# Patient Record
Sex: Female | Born: 1950
Health system: Southern US, Community
[De-identification: ages and names within clinical notes are randomized; demographics above are authoritative.]

## PROBLEM LIST (undated history)

## (undated) DIAGNOSIS — R7301 Impaired fasting glucose: Secondary | ICD-10-CM

## (undated) DIAGNOSIS — R319 Hematuria, unspecified: Secondary | ICD-10-CM

## (undated) DIAGNOSIS — K219 Gastro-esophageal reflux disease without esophagitis: Secondary | ICD-10-CM

## (undated) DIAGNOSIS — G9050 Complex regional pain syndrome I, unspecified: Secondary | ICD-10-CM

## (undated) DIAGNOSIS — I8393 Asymptomatic varicose veins of bilateral lower extremities: Secondary | ICD-10-CM

## (undated) DIAGNOSIS — I10 Essential (primary) hypertension: Secondary | ICD-10-CM

## (undated) DIAGNOSIS — E785 Hyperlipidemia, unspecified: Secondary | ICD-10-CM

## (undated) HISTORY — DX: Hematuria, unspecified: R31.9

## (undated) HISTORY — DX: Gastro-esophageal reflux disease without esophagitis: K21.9

## (undated) HISTORY — DX: Hyperlipidemia, unspecified: E78.5

## (undated) HISTORY — DX: Complex regional pain syndrome I, unspecified: G90.50

## (undated) HISTORY — DX: Impaired fasting glucose: R73.01

## (undated) HISTORY — DX: Essential (primary) hypertension: I10

## (undated) HISTORY — DX: Asymptomatic varicose veins of bilateral lower extremities: I83.93

---

## 1998-05-19 ENCOUNTER — Emergency Department (HOSPITAL_COMMUNITY): Admission: EM | Admit: 1998-05-19 | Discharge: 1998-05-19 | Payer: Self-pay | Admitting: Emergency Medicine

## 2000-06-03 ENCOUNTER — Other Ambulatory Visit: Admission: RE | Admit: 2000-06-03 | Discharge: 2000-06-03 | Payer: Self-pay | Admitting: Family Medicine

## 2000-11-29 ENCOUNTER — Other Ambulatory Visit: Admission: RE | Admit: 2000-11-29 | Discharge: 2000-11-29 | Payer: Self-pay | Admitting: Family Medicine

## 2000-12-10 ENCOUNTER — Encounter: Admission: RE | Admit: 2000-12-10 | Discharge: 2000-12-10 | Payer: Self-pay | Admitting: Family Medicine

## 2000-12-10 ENCOUNTER — Encounter: Payer: Self-pay | Admitting: Family Medicine

## 2001-07-03 ENCOUNTER — Other Ambulatory Visit: Admission: RE | Admit: 2001-07-03 | Discharge: 2001-07-03 | Payer: Self-pay | Admitting: Family Medicine

## 2001-08-18 ENCOUNTER — Ambulatory Visit (HOSPITAL_COMMUNITY): Admission: RE | Admit: 2001-08-18 | Discharge: 2001-08-18 | Payer: Self-pay | Admitting: Gastroenterology

## 2001-08-18 ENCOUNTER — Encounter (INDEPENDENT_AMBULATORY_CARE_PROVIDER_SITE_OTHER): Payer: Self-pay

## 2001-11-27 ENCOUNTER — Emergency Department (HOSPITAL_COMMUNITY): Admission: EM | Admit: 2001-11-27 | Discharge: 2001-11-27 | Payer: Self-pay | Admitting: *Deleted

## 2001-12-10 ENCOUNTER — Encounter: Admission: RE | Admit: 2001-12-10 | Discharge: 2001-12-10 | Payer: Self-pay | Admitting: Family Medicine

## 2001-12-10 ENCOUNTER — Encounter: Payer: Self-pay | Admitting: Family Medicine

## 2002-07-30 ENCOUNTER — Other Ambulatory Visit: Admission: RE | Admit: 2002-07-30 | Discharge: 2002-07-30 | Payer: Self-pay | Admitting: Family Medicine

## 2002-08-03 ENCOUNTER — Encounter: Payer: Self-pay | Admitting: Family Medicine

## 2002-08-03 ENCOUNTER — Encounter: Admission: RE | Admit: 2002-08-03 | Discharge: 2002-08-03 | Payer: Self-pay | Admitting: Family Medicine

## 2002-09-23 ENCOUNTER — Ambulatory Visit (HOSPITAL_COMMUNITY): Admission: RE | Admit: 2002-09-23 | Discharge: 2002-09-23 | Payer: Self-pay | Admitting: Gastroenterology

## 2002-09-25 ENCOUNTER — Encounter: Payer: Self-pay | Admitting: Gastroenterology

## 2002-09-25 ENCOUNTER — Ambulatory Visit (HOSPITAL_COMMUNITY): Admission: RE | Admit: 2002-09-25 | Discharge: 2002-09-25 | Payer: Self-pay | Admitting: Gastroenterology

## 2002-10-05 ENCOUNTER — Ambulatory Visit (HOSPITAL_COMMUNITY): Admission: RE | Admit: 2002-10-05 | Discharge: 2002-10-05 | Payer: Self-pay | Admitting: Gastroenterology

## 2002-10-05 ENCOUNTER — Encounter: Payer: Self-pay | Admitting: Gastroenterology

## 2003-08-09 ENCOUNTER — Encounter: Admission: RE | Admit: 2003-08-09 | Discharge: 2003-08-09 | Payer: Self-pay | Admitting: Family Medicine

## 2003-10-02 ENCOUNTER — Encounter: Admission: RE | Admit: 2003-10-02 | Discharge: 2003-10-02 | Payer: Self-pay | Admitting: Family Medicine

## 2004-08-01 ENCOUNTER — Other Ambulatory Visit: Admission: RE | Admit: 2004-08-01 | Discharge: 2004-08-01 | Payer: Self-pay | Admitting: Family Medicine

## 2005-08-28 ENCOUNTER — Other Ambulatory Visit: Admission: RE | Admit: 2005-08-28 | Discharge: 2005-08-28 | Payer: Self-pay | Admitting: Family Medicine

## 2006-08-28 ENCOUNTER — Other Ambulatory Visit: Admission: RE | Admit: 2006-08-28 | Discharge: 2006-08-28 | Payer: Self-pay | Admitting: Family Medicine

## 2006-09-12 ENCOUNTER — Encounter: Admission: RE | Admit: 2006-09-12 | Discharge: 2006-09-12 | Payer: Self-pay | Admitting: Family Medicine

## 2006-11-12 ENCOUNTER — Encounter: Admission: RE | Admit: 2006-11-12 | Discharge: 2006-11-12 | Payer: Self-pay | Admitting: Family Medicine

## 2006-12-29 ENCOUNTER — Encounter: Admission: RE | Admit: 2006-12-29 | Discharge: 2006-12-29 | Payer: Self-pay | Admitting: Family Medicine

## 2007-07-16 ENCOUNTER — Encounter: Admission: RE | Admit: 2007-07-16 | Discharge: 2007-07-16 | Payer: Self-pay | Admitting: Family Medicine

## 2007-08-28 ENCOUNTER — Other Ambulatory Visit: Admission: RE | Admit: 2007-08-28 | Discharge: 2007-08-28 | Payer: Self-pay | Admitting: Family Medicine

## 2007-10-10 ENCOUNTER — Encounter: Admission: RE | Admit: 2007-10-10 | Discharge: 2007-10-10 | Payer: Self-pay | Admitting: Family Medicine

## 2007-10-21 ENCOUNTER — Encounter: Admission: RE | Admit: 2007-10-21 | Discharge: 2007-10-21 | Payer: Self-pay | Admitting: Family Medicine

## 2008-08-30 ENCOUNTER — Other Ambulatory Visit: Admission: RE | Admit: 2008-08-30 | Discharge: 2008-08-30 | Payer: Self-pay | Admitting: Family Medicine

## 2009-06-03 ENCOUNTER — Encounter: Admission: RE | Admit: 2009-06-03 | Discharge: 2009-06-03 | Payer: Self-pay | Admitting: Family Medicine

## 2009-09-01 ENCOUNTER — Other Ambulatory Visit: Admission: RE | Admit: 2009-09-01 | Discharge: 2009-09-01 | Payer: Self-pay | Admitting: Family Medicine

## 2010-06-05 ENCOUNTER — Encounter
Admission: RE | Admit: 2010-06-05 | Discharge: 2010-06-05 | Payer: Self-pay | Source: Home / Self Care | Attending: Family Medicine | Admitting: Family Medicine

## 2010-09-29 NOTE — Procedures (Signed)
[facility]  Patient:    Nicole Mcintosh, KANGAS Visit Number: [id number] MRN: [id number]          Service Type: END Location: ENDO Attending Physician:  Dennison Bulla Ii Dictated by:   Verlin Grills, M.D. Proc. Date: 08/18/01 Admit Date:  08/18/2001   CC:         Desma Maxim, M.D.   Procedure Report  PROCEDURE:  Screening colonoscopy.  REFERRING PHYSICIAN:  Desma Maxim, M.D.  PROCEDURE INDICATION:  Ms. Kaylanni Ezelle. Lamboy is a 61 year old female born [birth year].  Ms. Kalis is referred for her first screening colonoscopy with polypectomy to prevent colon cancer.  I discussed with Ms. Villwock the complications associated with colonoscopy and polypectomy, including a 15 per 1000 risk of bleeding and 4 per 1000 risk of colon perforation requiring surgical repair.  Ms. Whitacre has signed the operative permit.  ENDOSCOPIST:  Verlin Grills, M.D.  PREMEDICATION:  Demerol 50 mg, Versed 10 mg.  ENDOSCOPE:  Olympus pediatric colonoscope.  DESCRIPTION OF PROCEDURE:  After obtaining informed consent, Ms. Dix was placed in the left lateral decubitus position.  I administered intravenous Demerol and intravenous Versed to achieve conscious sedation for the procedure.  The patients blood pressure, oxygen saturation, and cardiac rhythm were monitored throughout the procedure and documented in the medical record.  Anal inspection was normal.  Digital rectal exam was normal.  The Olympus pediatric video colonoscope was introduced into the rectum and easily advanced to the cecum.  Colonic preparation for the exam today was excellent.  RECTUM:  From the proximal-mid rectum, three 0.5 mm sessile polyps were removed with the cold biopsy forceps and submitted for pathological interpretation.  SIGMOID COLON AND DESCENDING COLON:  Normal.  SPLENIC FLEXURE:  Normal.  TRANSVERSE COLON:  Normal.  HEPATIC FLEXURE:   Normal.  ASCENDING COLON:  Normal.  CECUM AND ILEOCECAL VALVE:  Normal.  ASSESSMENT: 1. Three 0.5 mm sessile polyps were removed from the proximal-mid rectum and    appear to be hyperplastic polyps endoscopically. 2. Otherwise normal proctocolonoscopy to the cecum.  RECOMMENDATIONS: 1. If rectal polyps return neoplastic pathologically,  Ms. Batty should    undergo a repeat colonoscopy in five years. 2. If the rectal polyps are nonneoplastic polyps, Ms. Shimmel should undergo    a repeat colonoscopy in 10 years. Dictated by:   Verlin Grills, M.D. Attending Physician:  Dennison Bulla Ii DD:  08/18/01 TD:  08/18/01 Job: [id number] [id number]

## 2010-09-29 NOTE — Op Note (Signed)
NAME:  Nicole Mcintosh, Nicole Mcintosh                       ACCOUNT NO.:  [id number]   MEDICAL RECORD NO.:  [id number]                   PATIENT TYPE:  AMB   LOCATION:  ENDO                                 FACILITY:  [facility]   PHYSICIAN:  Danise Edge, M.D.                DATE OF BIRTH:  10-14-1950   DATE OF PROCEDURE:  09/23/2002  DATE OF DISCHARGE:                                 OPERATIVE REPORT   REFERRING PHYSICIAN:  Donia Guiles, M.D.   PROCEDURE:  Esophagogastroduodenoscopy.   PROCEDURE INDICATION:  Nicole Mcintosh is a 60 year old female, born  December 18, 1950.  Nicole Mcintosh is scheduled to undergo  esophagogastroduodenoscopy to evaluate persistent postprandial nausea and  vomiting for 6-8  months associated with weight loss.   ENDOSCOPIST:  Charolett Bumpers, M.D.   PREMEDICATION:  1. Versed 10 mg.  2. Demerol 50 mg.  3. Phenergan 12.5 mg.   DESCRIPTION OF PROCEDURE:  After obtaining informed consent, Nicole Mcintosh  was placed in the left lateral decubitus position.  I administered  intravenous Demerol and intravenous Versed to achieve conscious sedation for  the procedure.  The patient's blood pressure, oxygen saturation, and cardiac  rhythm were monitored throughout the procedure and documented in the medical  record.   The Olympus gastroscope was passed through the posterior hypopharynx into  the proximal esophagus without difficulty.  The hypopharynx, larynx, and  vocal cords appeared normal.   ESOPHAGOSCOPY:  The proximal, mid, and lower segments of the esophagus  appear completely normal.  Endoscopically, there is no evidence for the  presence of esophageal obstruction, esophageal mucosal scarring, Barrett's  esophagus, erosive esophagitis, or esophageal ulcers.   GASTROSCOPY:  Retroflexed view of the gastric cardia and fundus was normal.  There is no endoscopic evidence for the presence of a hiatal hernia.  The  gastric body appears completely normal.   There are scattered linear and  punctate erosions with exudative bases in the gastric antrum with a patent  and normal-appearing pylorus.  A biopsy was taken from the distal gastric  antrum for a CLOtest.   DUODENOSCOPY:  The duodenal bulb, mid duodenum, and distal duodenum appeared  normal.  I did visualize the major papillae tangentially, and the major  papillae appeared normal.   ASSESSMENT:  1. Erosions in the gastric antrum; otherwise normal     esophagogastroduodenoscopy.  2. I cannot explain Nicole Mcintosh' persistent nausea and vomiting.   PLAN:  I will schedule Nicole Mcintosh for a CT scan of the abdomen and pelvis  with particular attention to the pancreas and also looking for any signs of  small bowel obstruction.  If normal, I will schedule her for a nuclear  medicine gastric emptying study.  Danise Edge, M.D.    MJ/MEDQ  D:  09/23/2002  T:  09/23/2002  Job:  [id number]   cc:   Donia Guiles, M.D.  [address] [place]  [place] [address]  Fax: [phone]

## 2010-10-04 ENCOUNTER — Other Ambulatory Visit: Payer: Self-pay | Admitting: Family Medicine

## 2010-10-04 ENCOUNTER — Other Ambulatory Visit (HOSPITAL_COMMUNITY)
Admission: RE | Admit: 2010-10-04 | Discharge: 2010-10-04 | Disposition: A | Discharge status: Home / Self Care | Payer: Medicare Other | Source: Ambulatory Visit | Attending: Family Medicine | Admitting: Family Medicine

## 2010-10-04 DIAGNOSIS — Z124 Encounter for screening for malignant neoplasm of cervix: Secondary | ICD-10-CM | POA: Insufficient documentation

## 2011-05-21 ENCOUNTER — Other Ambulatory Visit: Payer: Self-pay | Admitting: Family Medicine

## 2011-05-21 DIAGNOSIS — Z1231 Encounter for screening mammogram for malignant neoplasm of breast: Secondary | ICD-10-CM

## 2011-06-07 ENCOUNTER — Ambulatory Visit: Payer: Medicare Other

## 2011-06-14 ENCOUNTER — Ambulatory Visit
Admission: RE | Admit: 2011-06-14 | Discharge: 2011-06-14 | Disposition: A | Discharge status: Home / Self Care | Payer: Medicare Other | Source: Ambulatory Visit | Attending: Family Medicine | Admitting: Family Medicine

## 2011-06-14 DIAGNOSIS — Z1231 Encounter for screening mammogram for malignant neoplasm of breast: Secondary | ICD-10-CM

## 2011-09-25 ENCOUNTER — Other Ambulatory Visit: Payer: Self-pay | Admitting: Gastroenterology

## 2012-04-16 ENCOUNTER — Ambulatory Visit: Payer: Medicare Other | Attending: Neurology | Admitting: Physical Therapy

## 2012-04-16 DIAGNOSIS — IMO0001 Reserved for inherently not codable concepts without codable children: Secondary | ICD-10-CM | POA: Insufficient documentation

## 2012-04-16 DIAGNOSIS — M25619 Stiffness of unspecified shoulder, not elsewhere classified: Secondary | ICD-10-CM | POA: Insufficient documentation

## 2012-04-16 DIAGNOSIS — M2550 Pain in unspecified joint: Secondary | ICD-10-CM | POA: Insufficient documentation

## 2012-04-22 ENCOUNTER — Ambulatory Visit: Payer: Medicare Other | Admitting: Physical Therapy

## 2012-04-25 ENCOUNTER — Encounter: Payer: Medicare Other | Admitting: Physical Therapy

## 2012-04-28 ENCOUNTER — Encounter: Payer: Medicare Other | Admitting: Physical Therapy

## 2012-04-30 ENCOUNTER — Encounter: Payer: Medicare Other | Admitting: Physical Therapy

## 2012-05-12 ENCOUNTER — Ambulatory Visit: Payer: Medicare Other | Admitting: Physical Therapy

## 2012-05-20 ENCOUNTER — Other Ambulatory Visit: Payer: Self-pay | Admitting: Family Medicine

## 2012-05-20 DIAGNOSIS — Z1231 Encounter for screening mammogram for malignant neoplasm of breast: Secondary | ICD-10-CM

## 2012-05-22 ENCOUNTER — Ambulatory Visit: Payer: Medicare Other | Attending: Neurology | Admitting: Physical Therapy

## 2012-05-22 DIAGNOSIS — M25619 Stiffness of unspecified shoulder, not elsewhere classified: Secondary | ICD-10-CM | POA: Insufficient documentation

## 2012-05-22 DIAGNOSIS — M2550 Pain in unspecified joint: Secondary | ICD-10-CM | POA: Insufficient documentation

## 2012-05-22 DIAGNOSIS — IMO0001 Reserved for inherently not codable concepts without codable children: Secondary | ICD-10-CM | POA: Insufficient documentation

## 2012-05-29 ENCOUNTER — Ambulatory Visit: Payer: Medicare Other | Admitting: Physical Therapy

## 2012-06-06 ENCOUNTER — Ambulatory Visit: Payer: Medicare Other | Admitting: Physical Therapy

## 2012-06-20 ENCOUNTER — Ambulatory Visit: Payer: Medicare Other

## 2012-06-30 ENCOUNTER — Ambulatory Visit: Payer: Medicare Other

## 2012-07-29 ENCOUNTER — Ambulatory Visit: Payer: Medicare Other

## 2012-07-31 ENCOUNTER — Ambulatory Visit
Admission: RE | Admit: 2012-07-31 | Discharge: 2012-07-31 | Disposition: A | Discharge status: Home / Self Care | Payer: Medicare Other | Source: Ambulatory Visit | Attending: Family Medicine | Admitting: Family Medicine

## 2012-07-31 DIAGNOSIS — Z1231 Encounter for screening mammogram for malignant neoplasm of breast: Secondary | ICD-10-CM

## 2013-06-24 ENCOUNTER — Other Ambulatory Visit: Payer: Self-pay

## 2013-06-24 DIAGNOSIS — Z1231 Encounter for screening mammogram for malignant neoplasm of breast: Secondary | ICD-10-CM

## 2013-08-03 ENCOUNTER — Ambulatory Visit: Payer: Medicare Other

## 2013-08-27 ENCOUNTER — Ambulatory Visit
Admission: RE | Admit: 2013-08-27 | Discharge: 2013-08-27 | Disposition: A | Discharge status: Home / Self Care | Payer: Medicare Other | Source: Ambulatory Visit

## 2013-08-27 DIAGNOSIS — Z1231 Encounter for screening mammogram for malignant neoplasm of breast: Secondary | ICD-10-CM

## 2013-10-29 ENCOUNTER — Other Ambulatory Visit (HOSPITAL_COMMUNITY)
Admission: RE | Admit: 2013-10-29 | Discharge: 2013-10-29 | Disposition: A | Discharge status: Home / Self Care | Payer: Medicare Other | Source: Ambulatory Visit | Attending: Family Medicine | Admitting: Family Medicine

## 2013-10-29 ENCOUNTER — Other Ambulatory Visit: Payer: Self-pay | Admitting: Family Medicine

## 2013-10-29 ENCOUNTER — Ambulatory Visit
Admission: RE | Admit: 2013-10-29 | Discharge: 2013-10-29 | Disposition: A | Discharge status: Home / Self Care | Payer: Medicare Other | Source: Ambulatory Visit | Attending: Family Medicine | Admitting: Family Medicine

## 2013-10-29 DIAGNOSIS — Z1151 Encounter for screening for human papillomavirus (HPV): Secondary | ICD-10-CM | POA: Insufficient documentation

## 2013-10-29 DIAGNOSIS — Z124 Encounter for screening for malignant neoplasm of cervix: Secondary | ICD-10-CM | POA: Insufficient documentation

## 2013-10-29 DIAGNOSIS — M79671 Pain in right foot: Secondary | ICD-10-CM

## 2013-11-02 LAB — CYTOLOGY - PAP

## 2013-11-27 ENCOUNTER — Ambulatory Visit (INDEPENDENT_AMBULATORY_CARE_PROVIDER_SITE_OTHER): Payer: Medicare Other | Admitting: Interventional Cardiology

## 2013-11-27 ENCOUNTER — Encounter: Payer: Self-pay | Admitting: *Deleted

## 2013-11-27 VITALS — BP 150/82 | HR 61 | Ht 67.0 in | Wt 230.0 lb

## 2013-11-27 DIAGNOSIS — E785 Hyperlipidemia, unspecified: Secondary | ICD-10-CM | POA: Insufficient documentation

## 2013-11-27 DIAGNOSIS — R072 Precordial pain: Secondary | ICD-10-CM

## 2013-11-27 DIAGNOSIS — I10 Essential (primary) hypertension: Secondary | ICD-10-CM

## 2013-11-27 DIAGNOSIS — I209 Angina pectoris, unspecified: Secondary | ICD-10-CM | POA: Insufficient documentation

## 2013-11-27 DIAGNOSIS — R9431 Abnormal electrocardiogram [ECG] [EKG]: Secondary | ICD-10-CM

## 2013-11-27 DIAGNOSIS — G9050 Complex regional pain syndrome I, unspecified: Secondary | ICD-10-CM | POA: Insufficient documentation

## 2013-11-27 DIAGNOSIS — K219 Gastro-esophageal reflux disease without esophagitis: Secondary | ICD-10-CM

## 2013-11-27 MED ORDER — NITROGLYCERIN 0.4 MG SL SUBL: 0.4000 mg | SUBLINGUAL_TABLET | SUBLINGUAL | Status: AC | PRN

## 2013-11-27 MED ORDER — ASPIRIN EC 81 MG PO TBEC: 81.0000 mg | DELAYED_RELEASE_TABLET | Freq: Every day | ORAL | Status: AC

## 2013-11-27 MED ORDER — LOVASTATIN 40 MG PO TABS: 40.0000 mg | ORAL_TABLET | Freq: Every day | ORAL | Status: AC

## 2013-11-27 NOTE — Progress Notes (Signed)
Patient ID: Nicole Mcintosh, female   DOB: 1950/12/19, 63 y.o.   MRN: [id number]   Date: 11/27/2013 ID: Nicole Mcintosh, DOB 06-Nov-1950, MRN [id number] PCP: No primary provider on file.  Reason:  Chest discomfort  ASSESSMENT;  1.  Prolonged chest pressure occurring on 11/20/2013 lasting 5-7 hours before resolving and associated with diaphoresis and dyspnea. 4 days later EKG demonstrated lateral and mid precordial T-wave inversions that were new and suggestive of ischemia. Since that time she has had fatigue and mild dyspnea on exertion. I am concerned that this all represents an acute coronary syndrome, that could even include the possibility of stress cardiomyopathy. The patient is now 7 days beyond the event. 2. Hypertension 3. Hyperlipidemia 4. Reflux 5. History of coronary atherosclerosis 6. Obesity 7. Glucose intolerant  PLAN:  1. ECG repeated (The EKG today demonstrates persisting T wave abnormality aVL, V1 through V2 but appears somewhat improved compared to the July 14 EKG done by Dr. Alroy Dust) per 2. Start aspirin 81 mg daily 3. Stress Cardiolite ASAP, within the next 3-5 days. If the study is abnormal I will perform coronary angiography at my earliest convenience. 4. Report to the emergency room if any recurrent chest tightness 5. Nitroglycerin if recurrent chest discomfort 6. Continue statin therapy 7. Continue beta blocker    SUBJECTIVE: Nicole Mcintosh is a 63 y.o. female who is referred for evaluation of chest discomfort. The episode occurred on 11/20/13. It occurred while she in the hospital while shopping. He was admitted sternal with radiation to the throat and associated with diaphoresis and dyspnea. The discomfort lasted approximately 6 hours before resolving. As it resolved, the discomfort moved into her back. Since that time she has had less energy and some dyspnea on exertion. She has not had a recurrence of the discomfort. She denies orthopnea PND. No prior  history of heart disease.   Allergies  Allergen Reactions  . Celebrex [Celecoxib] Swelling  . Cymbalta [Duloxetine Hcl]     Weight gain  . Lyrica [Pregabalin]     Weight gain   . Meloxicam     Inneffective      Medication List       This list is accurate as of: 11/27/13  4:53 PM.  Always use your most recent med list.               ascorbic acid 1000 MG tablet  Commonly known as:  VITAMIN C  Take 1,000 mg by mouth daily.     calcium citrate-vitamin D 315-200 MG-UNIT per tablet  Commonly known as:  CITRACAL+D  Take 1 tablet by mouth daily.     carisoprodol 350 MG tablet  Commonly known as:  SOMA  Take 350 mg by mouth 4 (four) times daily as needed for muscle spasms.     CENTRUM SILVER PO  Take by mouth daily.     D3-1000 1000 UNITS capsule  Generic drug:  Cholecalciferol  Take 1,000 Units by mouth daily.     folic acid 258 MCG tablet  Commonly known as:  FOLVITE  Take 400 mcg by mouth daily.     HYDROcodone-acetaminophen 10-325 MG per tablet  Commonly known as:  NORCO  Take 1 tablet by mouth every 6 (six) hours as needed.     irbesartan-hydrochlorothiazide 300-12.5 MG per tablet  Commonly known as:  AVALIDE  Take 1 tablet by mouth daily.     lansoprazole 30 MG capsule  Commonly known as:  PREVACID  Take 30  mg by mouth daily at 12 noon.     metoprolol 50 MG tablet  Commonly known as:  LOPRESSOR  Take 50 mg by mouth 2 (two) times daily.     Omega 3 1000 MG Caps  Take by mouth 4 (four) times daily.     omega-3 acid ethyl esters 1 G capsule  Commonly known as:  LOVAZA  Take by mouth 2 (two) times daily.     promethazine 25 MG tablet  Commonly known as:  PHENERGAN  Take 25 mg by mouth as needed for nausea or vomiting.     temazepam 30 MG capsule  Commonly known as:  RESTORIL  Take 30 mg by mouth at bedtime as needed for sleep.        Past Medical History  Diagnosis Date  . Hypertension   . Hyperlipidemia   . Esophageal reflux   . RSD  (reflex sympathetic dystrophy)   . Hematuria   . Impaired fasting glucose     History reviewed. No pertinent past surgical history.  History   Social History  . Marital Status: Divorced    Spouse Name: N/A    Number of Children: N/A  . Years of Education: N/A   Occupational History  . Not on file.   Social History Main Topics  . Smoking status: Former Smoker    Quit date: 05/14/1978  . Smokeless tobacco: Not on file  . Alcohol Use: No  . Drug Use: No  . Sexual Activity: Not on file   Other Topics Concern  . Not on file   Social History Narrative  . No narrative on file    Family History  Problem Relation Age of Onset  . Hyperlipidemia Mother   . Coronary artery disease Mother   . Heart attack Father   . Hypertension Father   . Coronary artery disease Father     ROS: No prior history of exertional chest discomfort or heart disease. There is a family history of premature coronary atherosclerosis. Father died of myocardial infarction at age 63. She denies claudication, stroke, and vascular disease history. Obese a long-standing history of hypertension. On therapy for her lipids and high blood pressure. Denies palpitations, transient neurological symptoms, nausea, vomiting, decreased appetite, and thyroid disease  Other systems negative for complaints.  OBJECTIVE: BP 150/82  Pulse 61  Ht 5\' 7"  (1.702 m)  Wt 230 lb (104.327 kg)  BMI 36.01 kg/m2,  General: No acute distress, obese HEENT: normal without or pallor Neck: JVD flat. Carotids absent Chest: Clear Cardiac: Murmur: None. Gallop: S4. Rhythm: Normal. Other: Normal Abdomen: Bruit: Absent. Pulsation: Absent Extremities: Edema: None. Pulses: 2+ Neuro: Normal Psych: Normal  ECG: T-wave abnormality 1, aVL, V1 through V2. Improved in appearance when compared to tracing from Dr. Alroy Dust on 11/24/13 when T-wave inversion was much deeper and extended to the 4.

## 2013-11-27 NOTE — Patient Instructions (Addendum)
Your physician has recommended you make the following change in your medication:  1. BABY ASPIRIN 81 MG DAILY  2. START NITRO AS NEEDED   Your physician has requested that you have en exercise stress myoview. For further information please visit [email or web address]. Please follow instruction sheet, as given.  Your physician recommends that you schedule a follow-up appointment in:  [place] IF ANY CHEST PAIN OCCURS    Nitroglycerin sublingual tablets What is this medicine? NITROGLYCERIN (nye troe GLI ser in) is a type of vasodilator. It relaxes blood vessels, increasing the blood and oxygen supply to your heart. This medicine is used to relieve chest pain caused by angina. It is also used to prevent chest pain before activities like climbing stairs, going outdoors in cold weather, or sexual activity. This medicine may be used for other purposes; ask your health care provider or pharmacist if you have questions. COMMON BRAND NAME(S): Nitroquick, Nitrostat, Nitrotab What should I tell my health care provider before I take this medicine? They need to know if you have any of these conditions: -anemia -head injury, recent stroke, or bleeding in the brain -liver disease -previous heart attack -an unusual or allergic reaction to nitroglycerin, other medicines, foods, dyes, or preservatives -pregnant or trying to get pregnant -breast-feeding How should I use this medicine? Take this medicine by mouth as needed. At the first sign of an angina attack (chest pain or tightness) place one tablet under your tongue. You can also take this medicine 5 to 10 minutes before an event likely to produce chest pain. Follow the directions on the prescription label. Let the tablet dissolve under the tongue. Do not swallow whole. Replace the dose if you accidentally swallow it. It will help if your mouth is not dry. Saliva around the tablet will help it to dissolve more quickly.  Do not eat or drink, smoke or chew tobacco while a tablet is dissolving. If you are not better within 5 minutes after taking ONE dose of nitroglycerin, call 9-1-1 immediately to seek emergency medical care. Do not take more than 3 nitroglycerin tablets over 15 minutes. If you take this medicine often to relieve symptoms of angina, your doctor or health care professional may provide you with different instructions to manage your symptoms. If symptoms do not go away after following these instructions, it is important to call 9-1-1 immediately. Do not take more than 3 nitroglycerin tablets over 15 minutes. Talk to your pediatrician regarding the use of this medicine in children. Special care may be needed. Overdosage: If you think you have taken too much of this medicine contact a poison control center or emergency room at once. NOTE: This medicine is only for you. Do not share this medicine with others. What if I miss a dose? This does not apply. This medicine is only used as needed. What may interact with this medicine? Do not take this medicine with any of the following medications: -certain migraine medicines like ergotamine and dihydroergotamine (DHE) -medicines used to treat erectile dysfunction like sildenafil, tadalafil, and vardenafil -riociguat This medicine may also interact with the following medications: -alteplase -aspirin -heparin -medicines for high blood pressure -medicines for mental depression -other medicines used to treat angina -phenothiazines like chlorpromazine, mesoridazine, prochlorperazine, thioridazine This list may not describe all possible interactions. Give your health care provider a list of all the medicines, herbs, non-prescription drugs, or dietary supplements you use. Also tell them if you smoke, drink  alcohol, or use illegal drugs. Some items may interact with your medicine. What should I watch for while using this medicine? Tell your doctor or health care  professional if you feel your medicine is no longer working. Keep this medicine with you at all times. Sit or lie down when you take your medicine to prevent falling if you feel dizzy or faint after using it. Try to remain calm. This will help you to feel better faster. If you feel dizzy, take several deep breaths and lie down with your feet propped up, or bend forward with your head resting between your knees. You may get drowsy or dizzy. Do not drive, use machinery, or do anything that needs mental alertness until you know how this drug affects you. Do not stand or sit up quickly, especially if you are an older patient. This reduces the risk of dizzy or fainting spells. Alcohol can make you more drowsy and dizzy. Avoid alcoholic drinks. Do not treat yourself for coughs, colds, or pain while you are taking this medicine without asking your doctor or health care professional for advice. Some ingredients may increase your blood pressure. What side effects may I notice from receiving this medicine? Side effects that you should report to your doctor or health care professional as soon as possible: -blurred vision -dry mouth -skin rash -sweating -the feeling of extreme pressure in the head -unusually weak or tired Side effects that usually do not require medical attention (report to your doctor or health care professional if they continue or are bothersome): -flushing of the face or neck -headache -irregular heartbeat, palpitations -nausea, vomiting This list may not describe all possible side effects. Call your doctor for medical advice about side effects. You may report side effects to FDA at 1-800-FDA-1088. Where should I keep my medicine? Keep out of the reach of children. Store at room temperature between 20 and 25 degrees C (68 and 77 degrees F). Store in [employer]. Protect from light and moisture. Keep tightly closed. Throw away any unused medicine after the expiration date. NOTE:  This sheet is a summary. It may not cover all possible information. If you have questions about this medicine, talk to your doctor, pharmacist, or health care provider.  2015, Elsevier/Gold Standard. (2013-02-19 10:27:26)

## 2013-11-30 NOTE — Addendum Note (Signed)
Addended byUlla Potash H on: 11/30/2013 07:53 AM   Modules accepted: Orders

## 2013-12-01 ENCOUNTER — Ambulatory Visit (HOSPITAL_COMMUNITY): Payer: Medicare Other | Attending: Cardiovascular Disease | Admitting: Radiology

## 2013-12-01 VITALS — BP 181/88 | Ht 67.0 in | Wt 231.0 lb

## 2013-12-01 DIAGNOSIS — Z8249 Family history of ischemic heart disease and other diseases of the circulatory system: Secondary | ICD-10-CM | POA: Insufficient documentation

## 2013-12-01 DIAGNOSIS — R5381 Other malaise: Secondary | ICD-10-CM | POA: Insufficient documentation

## 2013-12-01 DIAGNOSIS — R002 Palpitations: Secondary | ICD-10-CM | POA: Insufficient documentation

## 2013-12-01 DIAGNOSIS — Z87891 Personal history of nicotine dependence: Secondary | ICD-10-CM | POA: Insufficient documentation

## 2013-12-01 DIAGNOSIS — R9431 Abnormal electrocardiogram [ECG] [EKG]: Secondary | ICD-10-CM

## 2013-12-01 DIAGNOSIS — R42 Dizziness and giddiness: Secondary | ICD-10-CM | POA: Insufficient documentation

## 2013-12-01 DIAGNOSIS — R079 Chest pain, unspecified: Secondary | ICD-10-CM | POA: Insufficient documentation

## 2013-12-01 DIAGNOSIS — R61 Generalized hyperhidrosis: Secondary | ICD-10-CM | POA: Insufficient documentation

## 2013-12-01 DIAGNOSIS — I10 Essential (primary) hypertension: Secondary | ICD-10-CM | POA: Insufficient documentation

## 2013-12-01 DIAGNOSIS — R072 Precordial pain: Secondary | ICD-10-CM

## 2013-12-01 DIAGNOSIS — R0602 Shortness of breath: Secondary | ICD-10-CM

## 2013-12-01 DIAGNOSIS — R5383 Other fatigue: Secondary | ICD-10-CM

## 2013-12-01 MED ORDER — TECHNETIUM TC 99M SESTAMIBI GENERIC - CARDIOLITE
33.0000 | Freq: Once | INTRAVENOUS | Status: AC | PRN
  Administered 2013-12-01: 33 via INTRAVENOUS

## 2013-12-01 MED ORDER — REGADENOSON 0.4 MG/5ML IV SOLN
0.4000 mg | Freq: Once | INTRAVENOUS | Status: AC
  Administered 2013-12-01: 0.4 mg via INTRAVENOUS

## 2013-12-01 MED ORDER — TECHNETIUM TC 99M SESTAMIBI GENERIC - CARDIOLITE
11.0000 | Freq: Once | INTRAVENOUS | Status: AC | PRN
  Administered 2013-12-01: 11 via INTRAVENOUS

## 2013-12-01 MED ORDER — AMINOPHYLLINE 25 MG/ML IV SOLN
75.0000 mg | Freq: Once | INTRAVENOUS | Status: AC
  Administered 2013-12-01: 75 mg via INTRAVENOUS

## 2013-12-01 NOTE — Progress Notes (Signed)
[facility] [address] [place], [place] [address] [phone]    Cardiology Nuclear Med Study  Nicole Mcintosh is a 63 y.o. female     MRN : [id number]     DOB: 03-04-1951  Procedure Date: 12/01/2013  Nuclear Med Background Indication for Stress Test:  Evaluation for Ischemia and Abnormal EKG History:  none Cardiac Risk Factors: Family History - CAD, History of Smoking, Hypertension and Lipids  Symptoms:  Chest Pain (last date of chest discomfort 2 days ago), Diaphoresis, Dizziness, DOE, Fatigue and Palpitations   Nuclear Pre-Procedure Caffeine/Decaff Intake:  None> 12 hrs NPO After: 7:00am   Lungs:  clear O2 Sat: 98% on room air. IV 0.9% NS with Angio Cath:  22g  IV Site: R Wrist x 1, tolerated well IV Started by:  Irven Baltimore, RN  Chest Size (in):  36 Cup Size: DD  Height: 5\' 7"  (1.702 m)  Weight:  231 lb (104.781 kg)  BMI:  Body mass index is 36.17 kg/(m^2). Tech Comments:  Patient held Lopressor x 24 hrs. Irven Baltimore, RN.    Nuclear Med Study 1 or 2 day study: 1 day  Stress Test Type:  Treadmill/Lexiscan  Reading MD: N/A  Order Authorizing Provider:  Daneen Schick, III, MD  Resting Radionuclide: Technetium 57m Sestamibi  Resting Radionuclide Dose: 11.0 mCi   Stress Radionuclide:  Technetium 48m Sestamibi  Stress Radionuclide Dose: 33.0 mCi           Stress Protocol Rest HR: 60 Stress HR: 122  Rest BP: 181/88 Stress BP: 214/103  Exercise Time (min): 2:45 METS: 4.6   Predicted Max HR: 158 bpm % Max HR: 77.85 bpm Rate Pressure Product: 26322   Dose of Adenosine (mg):  n/a Dose of Lexiscan: 0.4 mg  Dose of Atropine (mg): n/a Dose of Dobutamine: n/a mcg/kg/min (at max HR)  Stress Test Technologist: Matilde Haymaker, RN  Nuclear Technologist:  Vedia Pereyra, CNMT     Rest Procedure:  Myocardial perfusion imaging was performed at rest 45 minutes following the intravenous administration of Technetium 26m Sestamibi. Rest ECG:  NSR - Normal EKG  Stress Procedure:  The patient exercised on the treadmill utilizing the Bruce Protocol for 2:45 minutes. The patient stopped due to Hypertension. The patient received Lexiscan 0.4mg  over 15 seconds. Technetium 51m Sestamibi was injected at 30 seconds.Patient denied any chest pain. There were no significant changes with Lexiscan. Quantitative spect imaging was performed after a 45 minute delay. Patient received Aminophylline 75 mg IV 45 minutes after infusion with continued nausea and headache. Stress ECG: No significant change from baseline ECG  QPS Raw Data Images:  Normal; no motion artifact; normal heart/lung ratio. Stress Images:  Normal homogeneous uptake in all areas of the myocardium. Rest Images:  Normal homogeneous uptake in all areas of the myocardium. Subtraction (SDS):  No evidence of ischemia. Transient Ischemic Dilatation (Normal <1.22):  1.28 Lung/Heart Ratio (Normal <0.45):  0.33  Quantitative Gated Spect Images QGS EDV:  106 ml QGS ESV:  56 ml  Impression Exercise Capacity:  Poor exercise capacity. Changed to lexiscan because of shortness of breath and hypertensive response. BP Response:  Hypertensive blood pressure response. Clinical Symptoms:  No chest pain. ECG Impression:  No significant ST segment change suggestive of ischemia. Comparison with Prior Nuclear Study: No previous nuclear study performed  Overall Impression:  Low risk stress nuclear study.  No ischemia seen on perfusion. Hypertensive response to exercise.   LV Ejection Fraction:  47%.  LV Wall Motion:  Normal Wall Motion  Darlin Coco MD

## 2013-12-04 ENCOUNTER — Telehealth: Payer: Self-pay

## 2013-12-04 NOTE — Telephone Encounter (Signed)
pt aware of myoview results.Low risk study. No evidence of significant blockage. Mild reduction in pumping ability. pt sts that she is stil having sob, f/u appt sch with Dr.Smith for 7/30@ 8:45.pt verbalized understanding.

## 2013-12-10 ENCOUNTER — Ambulatory Visit (INDEPENDENT_AMBULATORY_CARE_PROVIDER_SITE_OTHER): Payer: Medicare Other | Admitting: Interventional Cardiology

## 2013-12-10 ENCOUNTER — Encounter (HOSPITAL_COMMUNITY): Payer: Self-pay | Admitting: Pharmacy Technician

## 2013-12-10 ENCOUNTER — Encounter: Payer: Self-pay | Admitting: Interventional Cardiology

## 2013-12-10 VITALS — BP 131/90 | HR 62 | Ht 67.0 in | Wt 231.0 lb

## 2013-12-10 DIAGNOSIS — R9431 Abnormal electrocardiogram [ECG] [EKG]: Secondary | ICD-10-CM

## 2013-12-10 DIAGNOSIS — E785 Hyperlipidemia, unspecified: Secondary | ICD-10-CM

## 2013-12-10 DIAGNOSIS — I10 Essential (primary) hypertension: Secondary | ICD-10-CM

## 2013-12-10 DIAGNOSIS — I209 Angina pectoris, unspecified: Secondary | ICD-10-CM

## 2013-12-10 LAB — CBC WITH DIFFERENTIAL/PLATELET
BASOS ABS: 0 10*3/uL (ref 0.0–0.1)
Basophils Relative: 0.2 % (ref 0.0–3.0)
EOS PCT: 2.1 % (ref 0.0–5.0)
Eosinophils Absolute: 0.1 10*3/uL (ref 0.0–0.7)
HCT: 39.8 % (ref 36.0–46.0)
Hemoglobin: 13.2 g/dL (ref 12.0–15.0)
Lymphocytes Relative: 27.6 % (ref 12.0–46.0)
Lymphs Abs: 1.6 10*3/uL (ref 0.7–4.0)
MCHC: 33.3 g/dL (ref 30.0–36.0)
MCV: 95.5 fl (ref 78.0–100.0)
MONO ABS: 0.4 10*3/uL (ref 0.1–1.0)
Monocytes Relative: 6.6 % (ref 3.0–12.0)
NEUTROS PCT: 63.5 % (ref 43.0–77.0)
Neutro Abs: 3.7 10*3/uL (ref 1.4–7.7)
PLATELETS: 232 10*3/uL (ref 150.0–400.0)
RBC: 4.17 Mil/uL (ref 3.87–5.11)
RDW: 13.9 % (ref 11.5–15.5)
WBC: 5.8 10*3/uL (ref 4.0–10.5)

## 2013-12-10 LAB — PROTIME-INR
INR: 1 ratio (ref 0.8–1.0)
Prothrombin Time: 10.6 s (ref 9.6–13.1)

## 2013-12-10 LAB — BASIC METABOLIC PANEL
BUN: 17 mg/dL (ref 6–23)
CO2: 31 mEq/L (ref 19–32)
CREATININE: 0.9 mg/dL (ref 0.4–1.2)
Calcium: 9.2 mg/dL (ref 8.4–10.5)
Chloride: 104 mEq/L (ref 96–112)
GFR: 71.93 mL/min (ref >60.00)
Glucose, Bld: 116 mg/dL — ABNORMAL HIGH (ref 70–99)
Potassium: 4.3 mEq/L (ref 3.5–5.1)
Sodium: 141 mEq/L (ref 135–145)

## 2013-12-10 MED ORDER — ISOSORBIDE MONONITRATE ER 60 MG PO TB24: 60.0000 mg | ORAL_TABLET | Freq: Every day | ORAL | Status: AC

## 2013-12-10 NOTE — Patient Instructions (Signed)
Your physician has recommended you make the following change in your medication:  1) START Isosorbide(Imdur) 60mg  daily. An Rx has been sent to your pharmacy.  Your physician recommends that you have  lab work today  Your physician has requested that you have a cardiac catheterization. Cardiac catheterization is used to diagnose and/or treat various heart conditions. Doctors may recommend this procedure for a number of different reasons. The most common reason is to evaluate chest pain. Chest pain can be a symptom of coronary artery disease (CAD), and cardiac catheterization can show whether plaque is narrowing or blocking your heart's arteries. This procedure is also used to evaluate the valves, as well as measure the blood flow and oxygen levels in different parts of your heart. For further information please visit [email or web address]. Please follow instruction sheet, as given.

## 2013-12-10 NOTE — Progress Notes (Signed)
Patient ID: Nicole Mcintosh, female   DOB: 04/20/1951, 63 y.o.   MRN: [id number]    [address] [place], [place]  [address] Phone: [phone] Fax:  [phone]  Date:  12/10/2013   ID:  Nicole Mcintosh, DOB 26-Jul-1950, MRN [id number]  PCP:  Donnie Coffin, MD   ASSESSMENT:  1. Angina pectoris, occurring 6-7 times since I gave her the prescription for nitroglycerin, and relieved within minutes of sublingual administration 2. Recent low risk of myocardial perfusion study 3. History of esophageal reflux 4. Dyspnea on exertion 5. Hyperlipidemia 6. Hypertension 7. Precordial T-wave abnormality, new since the onset of angina/chest pain  PLAN:  1. Isosorbide mononitrate 60 mg per day. Continue to use sublingual nitroglycerin as needed for recurrent episodes of discomfort 2. Aspirin daily 3. Coronary angiography to rule out matched ischemia on myocardial perfusion imaging given the responsiveness to nitroglycerin and the continuing nature of her symptoms. I have described the procedure including the risks of stroke, death, myocardial infarction, bleeding, kidney injury, emergency surgery, among others. The patient has accepted the risks involved.  This was an extended office visit with greater than 90 minutes spent, 60 minutes of which was related to face-to-face time with the patient explaining the rationale and the nature of the recommended evaluation. Multiple questions were answered.    SUBJECTIVE: Nicole Mcintosh is a 63 y.o. female who had a prolonged episode of severe chest pressure radiating to the neck occurring on July 10 lasting several hours. I evaluated the patient here in the office after being referred by Dr. Alroy Dust. I gave her nitroglycerin to use if recurrent episodes of discomfort. She underwent a myocardial perfusion study that was low risk. Today however she tells me that she has continued to have episodes of chest tightness but not quite as severe,  occurred spontaneously, and are relieved by one sublingual nitroglycerin after 3-5 minutes. She does have exertional dyspnea. This is been present now for several months. The dyspnea is not associated with tightness. She denies orthopnea. There has been no lower extremity swelling. No history of pulmonary emboli. As mentioned before there is a strong family history of CAD. Father had a myocardial infarction in his early 50s. Mother also had CAD.   Wt Readings from Last 3 Encounters:  12/10/13 231 lb (104.781 kg)  12/01/13 231 lb (104.781 kg)  11/27/13 230 lb (104.327 kg)     Past Medical History  Diagnosis Date  . Hypertension   . Hyperlipidemia   . Esophageal reflux   . RSD (reflex sympathetic dystrophy)   . Hematuria   . Impaired fasting glucose     Current Outpatient Prescriptions  Medication Sig Dispense Refill  . ascorbic acid (VITAMIN C) 1000 MG tablet Take 1,000 mg by mouth daily.      Marland Kitchen aspirin EC 81 MG tablet Take 1 tablet (81 mg total) by mouth daily.  90 tablet  3  . calcium citrate-vitamin D (CITRACAL+D) 315-200 MG-UNIT per tablet Take 1 tablet by mouth daily.      . carisoprodol (SOMA) 350 MG tablet Take 350 mg by mouth 4 (four) times daily as needed for muscle spasms.      . Cholecalciferol (D3-1000) 1000 UNITS capsule Take 1,000 Units by mouth daily.      . folic acid (FOLVITE) 132 MCG tablet Take 400 mcg by mouth daily.      Marland Kitchen HYDROcodone-acetaminophen (NORCO) 10-325 MG per tablet Take 1 tablet by mouth every 6 (six)  hours as needed.      . irbesartan-hydrochlorothiazide (AVALIDE) 300-12.5 MG per tablet Take 1 tablet by mouth daily.      . lansoprazole (PREVACID) 30 MG capsule Take 30 mg by mouth daily at 12 noon.      . lovastatin (MEVACOR) 40 MG tablet Take 1 tablet (40 mg total) by mouth at bedtime.      . metoprolol (LOPRESSOR) 50 MG tablet Take 50 mg by mouth 2 (two) times daily.      . Multiple Vitamins-Minerals (CENTRUM SILVER PO) Take by mouth daily.      .  nitroGLYCERIN (NITROSTAT) 0.4 MG SL tablet Place 1 tablet (0.4 mg total) under the tongue every 5 (five) minutes as needed for chest pain.  25 tablet  5  . Omega 3 1000 MG CAPS Take by mouth 4 (four) times daily.      Marland Kitchen omega-3 acid ethyl esters (LOVAZA) 1 G capsule Take by mouth 2 (two) times daily.      . promethazine (PHENERGAN) 25 MG tablet Take 25 mg by mouth as needed for nausea or vomiting.      . temazepam (RESTORIL) 30 MG capsule Take 30 mg by mouth at bedtime as needed for sleep.       No current facility-administered medications for this visit.    Allergies:    Allergies  Allergen Reactions  . Celebrex [Celecoxib] Swelling  . Cymbalta [Duloxetine Hcl]     Weight gain  . Lyrica [Pregabalin]     Weight gain   . Meloxicam     Inneffective    Social History:  The patient  reports that she quit smoking about 35 years ago. She does not have any smokeless tobacco history on file. She reports that she does not drink alcohol or use illicit drugs.   ROS:  Please see the history of present illness.   No blood in the urine or stool. Denies syncope. Denies palpitations.   All other systems reviewed and negative.   OBJECTIVE: VS:  BP 131/90  Pulse 62  Ht 5\' 7"  (1.702 m)  Wt 231 lb (104.781 kg)  BMI 36.17 kg/m2 Well nourished, well developed, in no acute distress, obese HEENT: normal Neck: JVD flat. Carotid bruit absent  Cardiac:  normal S1, S2; RRR; no murmur Lungs:  clear to auscultation bilaterally, no wheezing, rhonchi or rales Abd: soft, nontender, no hepatomegaly Ext: Edema absent. Pulses 2+ bilateral. Radials are 2+ and symmetric. Skin: warm and dry Neuro:  CNs 2-12 intact, no focal abnormalities noted  EKG:  T-wave inversion V1 through V3 on EKG from 11/27/2013.       Signed, Illene Labrador III, MD 12/10/2013 9:02 AM

## 2013-12-12 ENCOUNTER — Other Ambulatory Visit: Payer: Self-pay | Admitting: Interventional Cardiology

## 2013-12-12 DIAGNOSIS — I209 Angina pectoris, unspecified: Secondary | ICD-10-CM

## 2013-12-16 ENCOUNTER — Ambulatory Visit (HOSPITAL_COMMUNITY)
Admission: RE | Admit: 2013-12-16 | Discharge: 2013-12-16 | Disposition: A | Discharge status: Home / Self Care | Payer: Medicare Other | Source: Ambulatory Visit | Attending: Interventional Cardiology | Admitting: Interventional Cardiology

## 2013-12-16 ENCOUNTER — Encounter (HOSPITAL_COMMUNITY)
Admission: RE | Disposition: A | Discharge status: Home / Self Care | Payer: Self-pay | Source: Ambulatory Visit | Attending: Interventional Cardiology

## 2013-12-16 DIAGNOSIS — Z8249 Family history of ischemic heart disease and other diseases of the circulatory system: Secondary | ICD-10-CM | POA: Diagnosis not present

## 2013-12-16 DIAGNOSIS — Z6836 Body mass index (BMI) 36.0-36.9, adult: Secondary | ICD-10-CM | POA: Insufficient documentation

## 2013-12-16 DIAGNOSIS — G9050 Complex regional pain syndrome I, unspecified: Secondary | ICD-10-CM | POA: Insufficient documentation

## 2013-12-16 DIAGNOSIS — Z87891 Personal history of nicotine dependence: Secondary | ICD-10-CM | POA: Insufficient documentation

## 2013-12-16 DIAGNOSIS — Z7982 Long term (current) use of aspirin: Secondary | ICD-10-CM | POA: Insufficient documentation

## 2013-12-16 DIAGNOSIS — I2510 Atherosclerotic heart disease of native coronary artery without angina pectoris: Secondary | ICD-10-CM | POA: Diagnosis not present

## 2013-12-16 DIAGNOSIS — E785 Hyperlipidemia, unspecified: Secondary | ICD-10-CM | POA: Insufficient documentation

## 2013-12-16 DIAGNOSIS — E669 Obesity, unspecified: Secondary | ICD-10-CM | POA: Diagnosis not present

## 2013-12-16 DIAGNOSIS — Z79899 Other long term (current) drug therapy: Secondary | ICD-10-CM | POA: Insufficient documentation

## 2013-12-16 DIAGNOSIS — I10 Essential (primary) hypertension: Secondary | ICD-10-CM | POA: Diagnosis not present

## 2013-12-16 DIAGNOSIS — K219 Gastro-esophageal reflux disease without esophagitis: Secondary | ICD-10-CM | POA: Insufficient documentation

## 2013-12-16 DIAGNOSIS — I209 Angina pectoris, unspecified: Secondary | ICD-10-CM

## 2013-12-16 DIAGNOSIS — R9431 Abnormal electrocardiogram [ECG] [EKG]: Secondary | ICD-10-CM

## 2013-12-16 HISTORY — PX: LEFT HEART CATHETERIZATION WITH CORONARY ANGIOGRAM: SHX5451

## 2013-12-16 SURGERY — LEFT HEART CATHETERIZATION WITH CORONARY ANGIOGRAM
Anesthesia: LOCAL

## 2013-12-16 MED ORDER — SODIUM CHLORIDE 0.9 % IV SOLN: 250.0000 mL | INTRAVENOUS | Status: DC | PRN

## 2013-12-16 MED ORDER — SODIUM CHLORIDE 0.9 % IJ SOLN: 3.0000 mL | INTRAMUSCULAR | Status: DC | PRN

## 2013-12-16 MED ORDER — FENTANYL CITRATE 0.05 MG/ML IJ SOLN
INTRAMUSCULAR | Status: AC
  Filled 2013-12-16: qty 2

## 2013-12-16 MED ORDER — MIDAZOLAM HCL 2 MG/2ML IJ SOLN
INTRAMUSCULAR | Status: AC
  Filled 2013-12-16: qty 2

## 2013-12-16 MED ORDER — NITROGLYCERIN 1 MG/10 ML FOR IR/CATH LAB
INTRA_ARTERIAL | Status: AC
Start: 2013-12-16 — End: 2013-12-16
  Filled 2013-12-16: qty 10

## 2013-12-16 MED ORDER — ASPIRIN 81 MG PO CHEW
81.0000 mg | CHEWABLE_TABLET | ORAL | Status: AC
  Administered 2013-12-16: 81 mg via ORAL

## 2013-12-16 MED ORDER — LIDOCAINE HCL (PF) 1 % IJ SOLN
INTRAMUSCULAR | Status: AC
  Filled 2013-12-16: qty 30

## 2013-12-16 MED ORDER — VERAPAMIL HCL 2.5 MG/ML IV SOLN
INTRAVENOUS | Status: AC
  Filled 2013-12-16: qty 2

## 2013-12-16 MED ORDER — HEPARIN (PORCINE) IN NACL 2-0.9 UNIT/ML-% IJ SOLN
INTRAMUSCULAR | Status: AC
  Filled 2013-12-16: qty 1000

## 2013-12-16 MED ORDER — SODIUM CHLORIDE 0.9 % IV SOLN
INTRAVENOUS | Status: DC
  Administered 2013-12-16: 13:00:00 via INTRAVENOUS

## 2013-12-16 MED ORDER — MIDAZOLAM HCL 2 MG/2ML IJ SOLN
INTRAMUSCULAR | Status: AC
Start: 2013-12-16 — End: 2013-12-16
  Filled 2013-12-16: qty 2

## 2013-12-16 MED ORDER — SODIUM CHLORIDE 0.9 % IJ SOLN: 3.0000 mL | Freq: Two times a day (BID) | INTRAMUSCULAR | Status: DC

## 2013-12-16 MED ORDER — ASPIRIN 81 MG PO CHEW
CHEWABLE_TABLET | ORAL | Status: AC
  Administered 2013-12-16: 81 mg via ORAL
  Filled 2013-12-16: qty 1

## 2013-12-16 MED ORDER — HEPARIN SODIUM (PORCINE) 1000 UNIT/ML IJ SOLN
INTRAMUSCULAR | Status: AC
  Filled 2013-12-16: qty 1

## 2013-12-16 MED ORDER — HEPARIN (PORCINE) IN NACL 2-0.9 UNIT/ML-% IJ SOLN
INTRAMUSCULAR | Status: AC
  Filled 2013-12-16: qty 500

## 2013-12-16 NOTE — Interval H&P Note (Signed)
Cath Lab Visit (complete for each Cath Lab visit)  Clinical Evaluation Leading to the Procedure:   ACS: No.  Non-ACS:    Anginal Classification: CCS III  Anti-ischemic medical therapy: Minimal Therapy (1 class of medications)  Non-Invasive Test Results: Low-risk stress test findings: cardiac mortality <1%/year  Prior CABG: No previous CABG      History and Physical Interval Note:  12/16/2013 2:15 PM  Nicole Mcintosh  has presented today for surgery, with the diagnosis of angina  The various methods of treatment have been discussed with the patient and family. After consideration of risks, benefits and other options for treatment, the patient has consented to  Procedure(s): LEFT HEART CATHETERIZATION WITH CORONARY ANGIOGRAM (N/A) as a surgical intervention .  The patient's history has been reviewed, patient examined, no change in status, stable for surgery.  I have reviewed the patient's chart and labs.  Questions were answered to the patient's satisfaction.     Sinclair Grooms

## 2013-12-16 NOTE — Discharge Instructions (Signed)
Radial Site Care °Refer to this sheet in the next few weeks. These instructions provide you with information on caring for yourself after your procedure. Your caregiver may also give you more specific instructions. Your treatment has been planned according to current medical practices, but problems sometimes occur. Call your caregiver if you have any problems or questions after your procedure. °HOME CARE INSTRUCTIONS °· You may shower the day after the procedure. Remove the bandage (dressing) and gently wash the site with plain soap and water. Gently pat the site dry. °· Do not apply powder or lotion to the site. °· Do not submerge the affected site in water for 3 to 5 days. °· Inspect the site at least twice daily. °· Do not flex or bend the affected arm for 24 hours. °· No lifting over 5 pounds (2.3 kg) for 5 days after your procedure. °· Do not drive home if you are discharged the same day of the procedure. Have someone else drive you. °· You may drive 24 hours after the procedure unless otherwise instructed by your caregiver. °· Do not operate machinery or power tools for 24 hours. °· A responsible adult should be with you for the first 24 hours after you arrive home. °What to expect: °· Any bruising will usually fade within 1 to 2 weeks. °· Blood that collects in the tissue (hematoma) may be painful to the touch. It should usually decrease in size and tenderness within 1 to 2 weeks. °SEEK IMMEDIATE MEDICAL CARE IF: °· You have unusual pain at the radial site. °· You have redness, warmth, swelling, or pain at the radial site. °· You have drainage (other than a small amount of blood on the dressing). °· You have chills. °· You have a fever or persistent symptoms for more than 72 hours. °· You have a fever and your symptoms suddenly get worse. °· Your arm becomes pale, cool, tingly, or numb. °· You have heavy bleeding from the site. Hold pressure on the site. °Document Released: 06/02/2010 Document Revised:  07/23/2011 Document Reviewed: 06/02/2010 °[employer]® Patient Information ©2015 [employer]. This information is not intended to replace advice given to you by your health care provider. Make sure you discuss any questions you have with your health care provider. ° °

## 2013-12-16 NOTE — CV Procedure (Signed)
     Left Heart Catheterization with Coronary Angiography  Report  Nicole Mcintosh  63 y.o.  female 1951/01/27  Procedure Date: 12/16/2013 Referring Physician: Mayra Neer, M.D. Primary Cardiologist: [facility] Blenda Bridegroom, M.D.  INDICATIONS: Angina pectoris responsive to nitroglycerin. Strong family history of CAD  PROCEDURE: 1. Left heart catheterization; 2. Coronary angiography; 3. Left ventriculography  CONSENT:  The risks, benefits, and details of the procedure were explained in detail to the patient. Risks including death, stroke, heart attack, kidney injury, allergy, limb ischemia, bleeding and radiation injury were discussed.  The patient verbalized understanding and wanted to proceed.  Informed written consent was obtained.  PROCEDURE TECHNIQUE:  After Xylocaine anesthesia a 5 French Slender sheath was placed in the right radial artery with an angiocath and the modified Seldinger technique.  Coronary angiography was done using a 5 F JR 4 and JL 3.5 catheter.  Left ventriculography was done using the JR 4 catheter and hand injection.   Mild calcium is noted in the proximal LAD. Minimal luminal irregularities are noted in the left coronary system. 30% mid right coronary narrowing was noted. No high-grade obstructions were seen.   CONTRAST:  Total of 75 cc.  COMPLICATIONS:  None   HEMODYNAMICS:  Aortic pressure 116/59 mmHg; LV pressure 117/4 mmHg; LVEDP 10 mm mercury  ANGIOGRAPHIC DATA:   The left main coronary artery is widely patent.  The left anterior descending artery is transapical, and quite tortuous. Luminal irregularities are noted proximal and mid vessel. Irregularities also noted in the first diagonal. No significant obstruction is seen..  The left circumflex artery is widely patent. It gives origin to 2 obtuse marginal branches. The marginals are tortuous. No significant obstructions noted..  The right coronary artery is dominant. Gives origin to a moderate size left  ventricular branch. The PDA is free of any obstruction. There is eccentric 30-40% narrowing in the mid vessel.   LEFT VENTRICULOGRAM:  Left ventricular angiogram was done in the 30 RAO projection and revealed left ventricular size and function to be normal. Estimated ejection fraction 60%   IMPRESSIONS:  1. No significant coronary obstruction is noted. Luminal irregularities are noted in the proximal and mid LAD and there is 30% stenosis in the mid right coronary. 2. Normal left ventricular systolic function with EF of 60%   RECOMMENDATION:  No obstructive basis for the patient's chest pain symptoms. This remote possibility that she is having coronary artery spasm. It is more likely that her chest discomfort is GI in origin related to esophageal spasm or inflammation. No further cardiac workup is needed.Marland Kitchen

## 2013-12-16 NOTE — H&P (View-Only) (Signed)
Patient ID: Nicole Mcintosh, female   DOB: January 16, 1951, 63 y.o.   MRN: [id number]    [address] [place], [place]  [address] Phone: [phone] Fax:  [phone]  Date:  12/10/2013   ID:  Nicole Mcintosh, DOB 1951/01/16, MRN [id number]  PCP:  Donnie Coffin, MD   ASSESSMENT:  1. Angina pectoris, occurring 6-7 times since I gave her the prescription for nitroglycerin, and relieved within minutes of sublingual administration 2. Recent low risk of myocardial perfusion study 3. History of esophageal reflux 4. Dyspnea on exertion 5. Hyperlipidemia 6. Hypertension 7. Precordial T-wave abnormality, new since the onset of angina/chest pain  PLAN:  1. Isosorbide mononitrate 60 mg per day. Continue to use sublingual nitroglycerin as needed for recurrent episodes of discomfort 2. Aspirin daily 3. Coronary angiography to rule out matched ischemia on myocardial perfusion imaging given the responsiveness to nitroglycerin and the continuing nature of her symptoms. I have described the procedure including the risks of stroke, death, myocardial infarction, bleeding, kidney injury, emergency surgery, among others. The patient has accepted the risks involved.  This was an extended office visit with greater than 90 minutes spent, 60 minutes of which was related to face-to-face time with the patient explaining the rationale and the nature of the recommended evaluation. Multiple questions were answered.    SUBJECTIVE: Nicole Mcintosh is a 63 y.o. female who had a prolonged episode of severe chest pressure radiating to the neck occurring on July 10 lasting several hours. I evaluated the patient here in the office after being referred by Dr. Alroy Dust. I gave her nitroglycerin to use if recurrent episodes of discomfort. She underwent a myocardial perfusion study that was low risk. Today however she tells me that she has continued to have episodes of chest tightness but not quite as severe,  occurred spontaneously, and are relieved by one sublingual nitroglycerin after 3-5 minutes. She does have exertional dyspnea. This is been present now for several months. The dyspnea is not associated with tightness. She denies orthopnea. There has been no lower extremity swelling. No history of pulmonary emboli. As mentioned before there is a strong family history of CAD. Father had a myocardial infarction in his early 29s. Mother also had CAD.   Wt Readings from Last 3 Encounters:  12/10/13 231 lb (104.781 kg)  12/01/13 231 lb (104.781 kg)  11/27/13 230 lb (104.327 kg)     Past Medical History  Diagnosis Date  . Hypertension   . Hyperlipidemia   . Esophageal reflux   . RSD (reflex sympathetic dystrophy)   . Hematuria   . Impaired fasting glucose     Current Outpatient Prescriptions  Medication Sig Dispense Refill  . ascorbic acid (VITAMIN C) 1000 MG tablet Take 1,000 mg by mouth daily.      Marland Kitchen aspirin EC 81 MG tablet Take 1 tablet (81 mg total) by mouth daily.  90 tablet  3  . calcium citrate-vitamin D (CITRACAL+D) 315-200 MG-UNIT per tablet Take 1 tablet by mouth daily.      . carisoprodol (SOMA) 350 MG tablet Take 350 mg by mouth 4 (four) times daily as needed for muscle spasms.      . Cholecalciferol (D3-1000) 1000 UNITS capsule Take 1,000 Units by mouth daily.      . folic acid (FOLVITE) 638 MCG tablet Take 400 mcg by mouth daily.      Marland Kitchen HYDROcodone-acetaminophen (NORCO) 10-325 MG per tablet Take 1 tablet by mouth every 6 (six)  hours as needed.      . irbesartan-hydrochlorothiazide (AVALIDE) 300-12.5 MG per tablet Take 1 tablet by mouth daily.      . lansoprazole (PREVACID) 30 MG capsule Take 30 mg by mouth daily at 12 noon.      . lovastatin (MEVACOR) 40 MG tablet Take 1 tablet (40 mg total) by mouth at bedtime.      . metoprolol (LOPRESSOR) 50 MG tablet Take 50 mg by mouth 2 (two) times daily.      . Multiple Vitamins-Minerals (CENTRUM SILVER PO) Take by mouth daily.      .  nitroGLYCERIN (NITROSTAT) 0.4 MG SL tablet Place 1 tablet (0.4 mg total) under the tongue every 5 (five) minutes as needed for chest pain.  25 tablet  5  . Omega 3 1000 MG CAPS Take by mouth 4 (four) times daily.      Marland Kitchen omega-3 acid ethyl esters (LOVAZA) 1 G capsule Take by mouth 2 (two) times daily.      . promethazine (PHENERGAN) 25 MG tablet Take 25 mg by mouth as needed for nausea or vomiting.      . temazepam (RESTORIL) 30 MG capsule Take 30 mg by mouth at bedtime as needed for sleep.       No current facility-administered medications for this visit.    Allergies:    Allergies  Allergen Reactions  . Celebrex [Celecoxib] Swelling  . Cymbalta [Duloxetine Hcl]     Weight gain  . Lyrica [Pregabalin]     Weight gain   . Meloxicam     Inneffective    Social History:  The patient  reports that she quit smoking about 35 years ago. She does not have any smokeless tobacco history on file. She reports that she does not drink alcohol or use illicit drugs.   ROS:  Please see the history of present illness.   No blood in the urine or stool. Denies syncope. Denies palpitations.   All other systems reviewed and negative.   OBJECTIVE: VS:  BP 131/90  Pulse 62  Ht 5\' 7"  (1.702 m)  Wt 231 lb (104.781 kg)  BMI 36.17 kg/m2 Well nourished, well developed, in no acute distress, obese HEENT: normal Neck: JVD flat. Carotid bruit absent  Cardiac:  normal S1, S2; RRR; no murmur Lungs:  clear to auscultation bilaterally, no wheezing, rhonchi or rales Abd: soft, nontender, no hepatomegaly Ext: Edema absent. Pulses 2+ bilateral. Radials are 2+ and symmetric. Skin: warm and dry Neuro:  CNs 2-12 intact, no focal abnormalities noted  EKG:  T-wave inversion V1 through V3 on EKG from 11/27/2013.       Signed, Illene Labrador III, MD 12/10/2013 9:02 AM

## 2013-12-18 ENCOUNTER — Telehealth: Payer: Self-pay

## 2013-12-24 ENCOUNTER — Telehealth: Payer: Self-pay | Admitting: Interventional Cardiology

## 2013-12-24 NOTE — Telephone Encounter (Signed)
° °  Patient would like a call with test results. Please call and advise.

## 2013-12-28 NOTE — Telephone Encounter (Signed)
Follow up   ° ° °Calling for test results.   °

## 2013-12-28 NOTE — Telephone Encounter (Signed)
Pt states she is calling for results of cardiac cath done 12/16/13.  RECOMMENDATION: No obstructive basis for the patient's chest pain symptoms. This remote possibility that she is having coronary artery spasm. It is more likely that her chest discomfort is GI in origin related to esophageal spasm or inflammation. No further cardiac workup is needed..  Pt given these results copied from cath report.

## 2014-01-14 NOTE — Telephone Encounter (Signed)
error 

## 2014-04-22 ENCOUNTER — Encounter (HOSPITAL_COMMUNITY): Payer: Self-pay | Admitting: Interventional Cardiology

## 2014-08-02 ENCOUNTER — Other Ambulatory Visit: Payer: Self-pay

## 2014-08-02 DIAGNOSIS — Z1231 Encounter for screening mammogram for malignant neoplasm of breast: Secondary | ICD-10-CM

## 2014-08-30 ENCOUNTER — Ambulatory Visit
Admission: RE | Admit: 2014-08-30 | Discharge: 2014-08-30 | Disposition: A | Discharge status: Home / Self Care | Payer: Medicare Other | Source: Ambulatory Visit

## 2014-08-30 DIAGNOSIS — Z1231 Encounter for screening mammogram for malignant neoplasm of breast: Secondary | ICD-10-CM

## 2014-08-31 ENCOUNTER — Other Ambulatory Visit: Payer: Self-pay | Admitting: Family Medicine

## 2014-08-31 DIAGNOSIS — R928 Other abnormal and inconclusive findings on diagnostic imaging of breast: Secondary | ICD-10-CM

## 2014-09-06 ENCOUNTER — Ambulatory Visit
Admission: RE | Admit: 2014-09-06 | Discharge: 2014-09-06 | Disposition: A | Discharge status: Home / Self Care | Payer: Medicare Other | Source: Ambulatory Visit | Attending: Family Medicine | Admitting: Family Medicine

## 2014-09-06 DIAGNOSIS — R928 Other abnormal and inconclusive findings on diagnostic imaging of breast: Secondary | ICD-10-CM

## 2015-02-09 ENCOUNTER — Other Ambulatory Visit (HOSPITAL_COMMUNITY): Payer: Self-pay | Admitting: Family Medicine

## 2015-02-09 DIAGNOSIS — R0609 Other forms of dyspnea: Principal | ICD-10-CM

## 2015-02-09 DIAGNOSIS — R0600 Dyspnea, unspecified: Secondary | ICD-10-CM

## 2015-02-15 ENCOUNTER — Ambulatory Visit (HOSPITAL_COMMUNITY): Payer: Medicare Other | Attending: Cardiology

## 2015-02-15 ENCOUNTER — Other Ambulatory Visit: Payer: Self-pay

## 2015-02-15 DIAGNOSIS — I358 Other nonrheumatic aortic valve disorders: Secondary | ICD-10-CM | POA: Insufficient documentation

## 2015-02-15 DIAGNOSIS — I340 Nonrheumatic mitral (valve) insufficiency: Secondary | ICD-10-CM | POA: Diagnosis not present

## 2015-02-15 DIAGNOSIS — R0600 Dyspnea, unspecified: Secondary | ICD-10-CM

## 2015-02-15 DIAGNOSIS — I10 Essential (primary) hypertension: Secondary | ICD-10-CM | POA: Diagnosis not present

## 2015-02-15 DIAGNOSIS — R0609 Other forms of dyspnea: Secondary | ICD-10-CM

## 2015-02-15 DIAGNOSIS — I071 Rheumatic tricuspid insufficiency: Secondary | ICD-10-CM | POA: Insufficient documentation

## 2015-08-10 ENCOUNTER — Other Ambulatory Visit: Payer: Self-pay

## 2015-08-10 DIAGNOSIS — Z1231 Encounter for screening mammogram for malignant neoplasm of breast: Secondary | ICD-10-CM

## 2015-09-06 ENCOUNTER — Ambulatory Visit: Payer: Medicare Other

## 2015-09-07 ENCOUNTER — Ambulatory Visit
Admission: RE | Admit: 2015-09-07 | Discharge: 2015-09-07 | Disposition: A | Discharge status: Home / Self Care | Payer: Medicare Other | Source: Ambulatory Visit

## 2015-09-07 DIAGNOSIS — Z1231 Encounter for screening mammogram for malignant neoplasm of breast: Secondary | ICD-10-CM

## 2016-07-05 DIAGNOSIS — M6530 Trigger finger, unspecified finger: Secondary | ICD-10-CM | POA: Diagnosis not present

## 2016-07-11 DIAGNOSIS — M180 Bilateral primary osteoarthritis of first carpometacarpal joints: Secondary | ICD-10-CM | POA: Diagnosis not present

## 2016-07-11 DIAGNOSIS — G5603 Carpal tunnel syndrome, bilateral upper limbs: Secondary | ICD-10-CM | POA: Diagnosis not present

## 2016-07-11 DIAGNOSIS — M65312 Trigger thumb, left thumb: Secondary | ICD-10-CM | POA: Diagnosis not present

## 2016-08-29 ENCOUNTER — Other Ambulatory Visit: Payer: Self-pay | Admitting: Family Medicine

## 2016-08-29 DIAGNOSIS — Z1231 Encounter for screening mammogram for malignant neoplasm of breast: Secondary | ICD-10-CM

## 2016-09-24 ENCOUNTER — Ambulatory Visit
Admission: RE | Admit: 2016-09-24 | Discharge: 2016-09-24 | Disposition: A | Discharge status: Home / Self Care | Payer: Medicare HMO | Source: Ambulatory Visit | Attending: Family Medicine | Admitting: Family Medicine

## 2016-09-24 DIAGNOSIS — Z1231 Encounter for screening mammogram for malignant neoplasm of breast: Secondary | ICD-10-CM

## 2016-09-27 DIAGNOSIS — I83891 Varicose veins of right lower extremities with other complications: Secondary | ICD-10-CM | POA: Diagnosis not present

## 2016-10-15 ENCOUNTER — Encounter: Payer: Self-pay | Admitting: Vascular Surgery

## 2016-10-15 ENCOUNTER — Ambulatory Visit (INDEPENDENT_AMBULATORY_CARE_PROVIDER_SITE_OTHER): Payer: Medicare HMO | Admitting: Vascular Surgery

## 2016-10-15 VITALS — BP 157/84 | HR 67 | Temp 98.0°F | Resp 16 | Ht 67.0 in | Wt 230.0 lb

## 2016-10-15 DIAGNOSIS — I83899 Varicose veins of unspecified lower extremities with other complications: Secondary | ICD-10-CM | POA: Diagnosis not present

## 2016-10-15 DIAGNOSIS — I83893 Varicose veins of bilateral lower extremities with other complications: Secondary | ICD-10-CM | POA: Insufficient documentation

## 2016-10-15 DIAGNOSIS — I868 Varicose veins of other specified sites: Secondary | ICD-10-CM

## 2016-10-15 NOTE — Progress Notes (Signed)
Subjective:     Patient ID: Nicole Mcintosh, female   DOB: 1951-05-14, 66 y.o.   MRN: [id number]  HPI This 66 year old female was referred by Dr. Donnie Coffin for evaluation of varicose veins with a history of bleeding. This occurred spontaneously from the right ankle 1 week ago. She was able to stop the bleeding with compression manually and a compression wrap. She does develops swelling in both legs is a day progresses. She has a strong family history of varicose veins her mother having had 2 operative procedures. She does develop heavy achy throbbing discomfort in both legs which worsens as the day progresses. She does not were elastic compression stockings per she has no history of DVT thrombophlebitis stasis ulcers.  Past Medical History:  Diagnosis Date  . Esophageal reflux   . Hematuria   . Hyperlipidemia   . Hypertension   . Impaired fasting glucose   . RSD (reflex sympathetic dystrophy)   . Varicose veins of both lower extremities     Social History  Substance Use Topics  . Smoking status: Former Smoker    Quit date: 05/14/1978  . Smokeless tobacco: Never Used  . Alcohol use No    Family History  Problem Relation Age of Onset  . Hyperlipidemia Mother   . Coronary artery disease Mother   . Heart attack Father   . Hypertension Father   . Coronary artery disease Father   . Breast cancer Maternal Aunt     Allergies  Allergen Reactions  . Celebrex [Celecoxib] Swelling  . Cymbalta [Duloxetine Hcl] Other (See Comments)    Weight gain Weight gain  . Lyrica [Pregabalin] Other (See Comments)    Weight gain Weight gain   . Meloxicam Other (See Comments)    Inneffective Inneffective     Current Outpatient Prescriptions:  .  ascorbic acid (VITAMIN C) 1000 MG tablet, Take 1,000 mg by mouth daily., Disp: , Rfl:  .  calcium citrate-vitamin D (CITRACAL+D) 315-200 MG-UNIT per tablet, Take 1 tablet by mouth daily., Disp: , Rfl:  .  carisoprodol (SOMA) 350 MG tablet, Take  350 mg by mouth 4 (four) times daily as needed for muscle spasms., Disp: , Rfl:  .  Cholecalciferol (D3-1000) 1000 UNITS capsule, Take 1,000 Units by mouth daily., Disp: , Rfl:  .  folic acid (FOLVITE) 026 MCG tablet, Take 800 mcg by mouth daily. , Disp: , Rfl:  .  HYDROcodone-acetaminophen (NORCO) 10-325 MG per tablet, Take 1 tablet by mouth every 4 (four) hours as needed (pain). , Disp: , Rfl:  .  irbesartan-hydrochlorothiazide (AVALIDE) 300-12.5 MG per tablet, Take 1 tablet by mouth daily., Disp: , Rfl:  .  lovastatin (MEVACOR) 40 MG tablet, Take 1 tablet (40 mg total) by mouth at bedtime., Disp: , Rfl:  .  metoprolol (LOPRESSOR) 50 MG tablet, Take 50 mg by mouth 2 (two) times daily., Disp: , Rfl:  .  Multiple Vitamin (MULTIVITAMIN WITH MINERALS) TABS tablet, Take 1 tablet by mouth daily. Centrum Silver, Disp: , Rfl:  .  nitroGLYCERIN (NITROSTAT) 0.4 MG SL tablet, Place 1 tablet (0.4 mg total) under the tongue every 5 (five) minutes as needed for chest pain., Disp: 25 tablet, Rfl: 5 .  Probiotic Product (PROBIOTIC PO), Take 1 tablet by mouth at bedtime., Disp: , Rfl:  .  promethazine (PHENERGAN) 25 MG tablet, Take 25 mg by mouth 2 (two) times daily as needed (migraines). , Disp: , Rfl:  .  temazepam (RESTORIL) 30 MG capsule, Take 30  mg by mouth at bedtime as needed for sleep., Disp: , Rfl:  .  aspirin EC 81 MG tablet, Take 1 tablet (81 mg total) by mouth daily. (Patient not taking: Reported on 10/15/2016), Disp: 90 tablet, Rfl: 3 .  isosorbide mononitrate (IMDUR) 60 MG 24 hr tablet, Take 1 tablet (60 mg total) by mouth daily. (Patient not taking: Reported on 10/15/2016), Disp: 30 tablet, Rfl: 5 .  lansoprazole (PREVACID) 30 MG capsule, Take 30 mg by mouth daily as needed (acid reflux). , Disp: , Rfl:  .  Omega-3 Fatty Acids (FISH OIL) 1000 MG CAPS, Take 1,000 mg by mouth 2 (two) times daily., Disp: , Rfl:   Vitals:   10/15/16 0958 10/15/16 1002  BP: (!) 177/91 (!) 157/84  Pulse: 67 67  Resp: 16    Temp: 98 F (36.7 C)   SpO2: 96%   Weight: 230 lb (104.3 kg)   Height: 5\' 7"  (1.702 m)     Body mass index is 36.02 kg/m.         Review of Systems   denies chest pain, dyspnea on exertion, PND, orthopnea, hemoptysis. Does have history of hypertension and hyperlipidemia as well as reflex sympathetic dystrophy. Objective:   Physical Exam BP (!) 157/84 (BP Location: Left Arm, Patient Position: Sitting, Cuff Size: Large)   Pulse 67   Temp 98 F (36.7 C)   Resp 16   Ht 5\' 7"  (1.702 m)   Wt 230 lb (104.3 kg)   SpO2 96%   BMI 36.02 kg/m     Gen.-alert and oriented x3 in no apparent distress-obese HEENT normal for age Lungs no rhonchi or wheezing Cardiovascular regular rhythm no murmurs carotid pulses 3+ palpable no bruits audible Abdomen soft nontender no palpable masses-obese  Musculoskeletal free of  major deformities Skin clear -no rashes Neurologic normal Lower extremities 3+ femoral and dorsalis pedis pulses palpable bilaterally with 1+ edema bilaterally Right leg with large cluster of spider and reticular veins in right medial malleolar area and proximally where bleeding occurred. No ulceration noted Prominent reticular veins and posterior lateral left thigh. No large bulging varicosities noted. No hyperpigmentation noted.  Today I performed a bedside SonoSite ultrasound exam which reveals enlargement of both great saphenous systems which have apparent reflux throughout       Assessment:     Bilateral varicose veins with episode of spontaneous bleeding right ankle 1 week ago which stopped with compression Possible gross reflux bilateral great saphenous systems on bedside SonoSite ultrasound exam Hypertension Hyperlipidemia Reflex synthetic dystrophy    Plan:         #1 long leg elastic compression stockings 20-30 mm gradient #2 elevate legs as much as possible #3 ibuprofen daily on a regular basis for pain #4 return in 3 months-she will have formal  venous reflux exam upon return in 3 months and I will make formal recommendation She may need laser ablation of bilateral great saphenous veins to treat her pain and swelling if this is indicated and also sclerotherapy of the bleeding site in the right ankle If she should have recurrent bleeding in the interim she will be in touch with [place] for sooner follow-up

## 2016-10-15 NOTE — Progress Notes (Signed)
Vitals:   10/15/16 0958  BP: (!) 177/91  Pulse: 67  Resp: 16  Temp: 98 F (36.7 C)  SpO2: 96%  Weight: 230 lb (104.3 kg)  Height: 5\' 7"  (1.702 m)

## 2016-12-04 DIAGNOSIS — E782 Mixed hyperlipidemia: Secondary | ICD-10-CM | POA: Diagnosis not present

## 2016-12-04 DIAGNOSIS — G9050 Complex regional pain syndrome I, unspecified: Secondary | ICD-10-CM | POA: Diagnosis not present

## 2016-12-04 DIAGNOSIS — M79645 Pain in left finger(s): Secondary | ICD-10-CM | POA: Diagnosis not present

## 2016-12-04 DIAGNOSIS — I10 Essential (primary) hypertension: Secondary | ICD-10-CM | POA: Diagnosis not present

## 2016-12-04 DIAGNOSIS — R69 Illness, unspecified: Secondary | ICD-10-CM | POA: Diagnosis not present

## 2016-12-04 DIAGNOSIS — R7301 Impaired fasting glucose: Secondary | ICD-10-CM | POA: Diagnosis not present

## 2016-12-10 ENCOUNTER — Encounter: Payer: Medicare HMO | Admitting: Vascular Surgery

## 2017-01-09 ENCOUNTER — Encounter: Payer: Self-pay | Admitting: Vascular Surgery

## 2017-01-11 DIAGNOSIS — M79645 Pain in left finger(s): Secondary | ICD-10-CM | POA: Diagnosis not present

## 2017-01-11 DIAGNOSIS — M1812 Unilateral primary osteoarthritis of first carpometacarpal joint, left hand: Secondary | ICD-10-CM | POA: Diagnosis not present

## 2017-01-11 DIAGNOSIS — G5602 Carpal tunnel syndrome, left upper limb: Secondary | ICD-10-CM | POA: Diagnosis not present

## 2017-01-11 DIAGNOSIS — M25532 Pain in left wrist: Secondary | ICD-10-CM | POA: Diagnosis not present

## 2017-01-11 DIAGNOSIS — M65312 Trigger thumb, left thumb: Secondary | ICD-10-CM | POA: Diagnosis not present

## 2017-01-15 ENCOUNTER — Ambulatory Visit: Payer: Medicare HMO | Admitting: Vascular Surgery

## 2017-02-08 ENCOUNTER — Other Ambulatory Visit: Payer: Self-pay | Admitting: *Deleted

## 2017-02-08 DIAGNOSIS — I83893 Varicose veins of bilateral lower extremities with other complications: Secondary | ICD-10-CM

## 2017-02-11 DIAGNOSIS — M65312 Trigger thumb, left thumb: Secondary | ICD-10-CM | POA: Diagnosis not present

## 2017-02-11 DIAGNOSIS — M1812 Unilateral primary osteoarthritis of first carpometacarpal joint, left hand: Secondary | ICD-10-CM | POA: Diagnosis not present

## 2017-02-11 DIAGNOSIS — G5602 Carpal tunnel syndrome, left upper limb: Secondary | ICD-10-CM | POA: Diagnosis not present

## 2017-02-12 ENCOUNTER — Ambulatory Visit (INDEPENDENT_AMBULATORY_CARE_PROVIDER_SITE_OTHER): Payer: Medicare HMO | Admitting: Vascular Surgery

## 2017-02-12 ENCOUNTER — Ambulatory Visit (HOSPITAL_COMMUNITY)
Admission: RE | Admit: 2017-02-12 | Discharge: 2017-02-12 | Disposition: A | Discharge status: Home / Self Care | Payer: Medicare HMO | Source: Ambulatory Visit | Attending: Vascular Surgery | Admitting: Vascular Surgery

## 2017-02-12 ENCOUNTER — Encounter: Payer: Self-pay | Admitting: Vascular Surgery

## 2017-02-12 VITALS — BP 186/91 | HR 61 | Temp 98.5°F | Resp 18 | Ht 67.0 in | Wt 230.0 lb

## 2017-02-12 DIAGNOSIS — M7989 Other specified soft tissue disorders: Secondary | ICD-10-CM | POA: Diagnosis not present

## 2017-02-12 DIAGNOSIS — I83893 Varicose veins of bilateral lower extremities with other complications: Secondary | ICD-10-CM | POA: Diagnosis not present

## 2017-02-12 NOTE — Progress Notes (Signed)
Subjective:     Patient ID: Nicole Mcintosh, female   DOB: [birth year], 66 y.o.   MRN: [id number]  HPI This 66 year old female returns 3 months post-evaluation for an episode of bleeding from reticular veins in the right ankle area and bilateral pain and swelling due to gross reflux in the bilateral great saphenous veins. She is tried long-leg elastic compression stockings plus elevation and ibuprofen with no improvement in her symptoms. She has had no further bleeding since her episode prior to her evaluation. She has no history of DVT or thrombophlebitis. Her symptoms are affecting her daily living in her improved slightly with long-leg 20-30 millimeter compression stockings.  Past Medical History:  Diagnosis Date  . Esophageal reflux   . Hematuria   . Hyperlipidemia   . Hypertension   . Impaired fasting glucose   . RSD (reflex sympathetic dystrophy)   . Varicose veins of both lower extremities     Social History  Substance Use Topics  . Smoking status: Former Smoker    Quit date: 05/14/1978  . Smokeless tobacco: Never Used  . Alcohol use No    Family History  Problem Relation Age of Onset  . Hyperlipidemia Mother   . Coronary artery disease Mother   . Heart attack Father   . Hypertension Father   . Coronary artery disease Father   . Breast cancer Maternal Aunt     Allergies  Allergen Reactions  . Celebrex [Celecoxib] Swelling  . Cymbalta [Duloxetine Hcl] Other (See Comments)    Weight gain Weight gain  . Lyrica [Pregabalin] Other (See Comments)    Weight gain Weight gain   . Meloxicam Other (See Comments)    Inneffective Inneffective     Current Outpatient Prescriptions:  .  ascorbic acid (VITAMIN C) 1000 MG tablet, Take 1,000 mg by mouth daily., Disp: , Rfl:  .  aspirin EC 81 MG tablet, Take 1 tablet (81 mg total) by mouth daily. (Patient not taking: Reported on 10/15/2016), Disp: 90 tablet, Rfl: 3 .  calcium citrate-vitamin D (CITRACAL+D) 315-200 MG-UNIT per  tablet, Take 1 tablet by mouth daily., Disp: , Rfl:  .  carisoprodol (SOMA) 350 MG tablet, Take 350 mg by mouth 4 (four) times daily as needed for muscle spasms., Disp: , Rfl:  .  Cholecalciferol (D3-1000) 1000 UNITS capsule, Take 1,000 Units by mouth daily., Disp: , Rfl:  .  folic acid (FOLVITE) 409 MCG tablet, Take 800 mcg by mouth daily. , Disp: , Rfl:  .  HYDROcodone-acetaminophen (NORCO) 10-325 MG per tablet, Take 1 tablet by mouth every 4 (four) hours as needed (pain). , Disp: , Rfl:  .  irbesartan-hydrochlorothiazide (AVALIDE) 300-12.5 MG per tablet, Take 1 tablet by mouth daily., Disp: , Rfl:  .  isosorbide mononitrate (IMDUR) 60 MG 24 hr tablet, Take 1 tablet (60 mg total) by mouth daily. (Patient not taking: Reported on 10/15/2016), Disp: 30 tablet, Rfl: 5 .  lansoprazole (PREVACID) 30 MG capsule, Take 30 mg by mouth daily as needed (acid reflux). , Disp: , Rfl:  .  lovastatin (MEVACOR) 40 MG tablet, Take 1 tablet (40 mg total) by mouth at bedtime., Disp: , Rfl:  .  metoprolol (LOPRESSOR) 50 MG tablet, Take 50 mg by mouth 2 (two) times daily., Disp: , Rfl:  .  Multiple Vitamin (MULTIVITAMIN WITH MINERALS) TABS tablet, Take 1 tablet by mouth daily. Centrum Silver, Disp: , Rfl:  .  nitroGLYCERIN (NITROSTAT) 0.4 MG SL tablet, Place 1 tablet (0.4 mg total) under  the tongue every 5 (five) minutes as needed for chest pain., Disp: 25 tablet, Rfl: 5 .  Omega-3 Fatty Acids (FISH OIL) 1000 MG CAPS, Take 1,000 mg by mouth 2 (two) times daily., Disp: , Rfl:  .  Probiotic Product (PROBIOTIC PO), Take 1 tablet by mouth at bedtime., Disp: , Rfl:  .  promethazine (PHENERGAN) 25 MG tablet, Take 25 mg by mouth 2 (two) times daily as needed (migraines). , Disp: , Rfl:  .  temazepam (RESTORIL) 30 MG capsule, Take 30 mg by mouth at bedtime as needed for sleep., Disp: , Rfl:   There were no vitals filed for this visit.  There is no height or weight on file to calculate BMI.         Review of  Systems Denies chest pain, dyspnea on exertion, PND, orthopnea, hemoptysis, claudication    Objective:   Physical Exam There were no vitals taken for this visit.  Gen. well-developed well-nourished female no apparent distress alert and oriented 3 Right leg with prominent reticular vein AV last backed ankle area proximal to medial malleolus were previous bleeding occurred. Other spider and reticular veins noted in lateral thigh. Left leg with reticular and spider veins and medial and lateral thigh areas. 1+ edema bilaterally with no hyperpigmentation or ulceration noted.  Today I ordered bilateral reflux venous study in our vascular lab which I reviewed and interpreted. There is enlargement of bilateral great saphenous veins with reflux throughout and no DVT     Assessment:     #1 pain and swelling bilateral lower extremities due to gross reflux bilateral great saphenous veins and one episode of significant bleeding from right ankle varicose vein 3 months ago. This concerns patient and the symptoms are affecting her daily living and are resistant to conservative measures including long-leg elastic compression stockings 20-30 millimeter gradient, elevation, and ibuprofen    Plan:     Patient needs #1 laser ablation right great saphenous vein to be followed by 1 course of sclerotherapy #2 laser ablation left great saphenous vein followed by 1 course of sclerotherapy We'll proceed with precertification to perform this in the near future and hopefully improve her pain and swelling and decrease the likelihood of any further bleeding episodes

## 2017-02-14 ENCOUNTER — Other Ambulatory Visit: Payer: Self-pay | Admitting: *Deleted

## 2017-02-14 DIAGNOSIS — I83893 Varicose veins of bilateral lower extremities with other complications: Secondary | ICD-10-CM

## 2017-02-28 DIAGNOSIS — R69 Illness, unspecified: Secondary | ICD-10-CM | POA: Diagnosis not present

## 2017-03-11 ENCOUNTER — Ambulatory Visit (INDEPENDENT_AMBULATORY_CARE_PROVIDER_SITE_OTHER): Payer: Medicare HMO | Admitting: Vascular Surgery

## 2017-03-11 ENCOUNTER — Encounter: Payer: Self-pay | Admitting: Vascular Surgery

## 2017-03-11 VITALS — BP 146/68 | HR 55 | Temp 97.8°F | Resp 18 | Ht 67.0 in | Wt 230.0 lb

## 2017-03-11 DIAGNOSIS — I83891 Varicose veins of right lower extremities with other complications: Secondary | ICD-10-CM | POA: Diagnosis not present

## 2017-03-11 HISTORY — PX: ENDOVENOUS ABLATION SAPHENOUS VEIN W/ LASER: SUR449

## 2017-03-11 NOTE — Progress Notes (Signed)
Subjective:     Patient ID: Nicole Mcintosh, female   DOB: December 08, 1950, 66 y.o.   MRN: [id number]  HPI This 66 year old female had laser ablation of the right great saphenous vein from the proximal calf to near the saphenofemoral junction performed under local tumescent anesthesia. A total of 2290 J of energy was utilized. She tolerated the procedure well.  Review of Systems     Objective:   Physical Exam BP (!) 146/68 (BP Location: Left Arm, Patient Position: Sitting, Cuff Size: Large)   Pulse (!) 55   Temp 97.8 F (36.6 C) (Oral)   Resp 18   Ht 5\' 7"  (1.702 m)   Wt 230 lb (104.3 kg)   SpO2 98%   BMI 36.02 kg/m        Assessment:     Well-tolerated laser ablation right great saphenous vein performed under local tumescent anesthesia    Plan:     Return in 1 week for venous duplex exam, to confirm closure of right great saphenous vein

## 2017-03-11 NOTE — Progress Notes (Signed)
Laser Ablation Procedure    Date: 03/11/2017   Nicole Mcintosh DOB:1951/03/08  Consent signed: Yes    Surgeon:  Dr. Nelda Severe. Kellie Simmering  Procedure: Laser Ablation: right Greater Saphenous Vein  BP (!) 146/68 (BP Location: Left Arm, Patient Position: Sitting, Cuff Size: Large)   Pulse (!) 55   Temp 97.8 F (36.6 C) (Oral)   Resp 18   Ht 5\' 7"  (1.702 m)   Wt 230 lb (104.3 kg)   SpO2 98%   BMI 36.02 kg/m   Tumescent Anesthesia: 475 cc 0.9% NaCl with 25cc Lidocaine HCL 2% and 15 cc 8.4% NaHCO3  Local Anesthesia: 4 cc Lidocaine HCL and NaHCO3 (ratio 2:1)  Pulsed Mode: 15 watts, 513ms delay, 1.0 duration  Total Energy: 2290 Joules             Total Pulses: 153               Total Time: 2:32       Patient tolerated procedure well    Description of Procedure:  After marking the course of the secondary varicosities, the patient was placed on the operating table in the supine position, and the right leg was prepped and draped in sterile fashion.   Local anesthetic was administered and under ultrasound guidance the saphenous vein was accessed with a micro needle and guide wire; then the mirco puncture sheath was placed.  A guide wire was inserted saphenofemoral junction , followed by a 5 french sheath.  The position of the sheath and then the laser fiber below the junction was confirmed using the ultrasound.  Tumescent anesthesia was administered along the course of the saphenous vein using ultrasound guidance. The patient was placed in Trendelenburg position and protective laser glasses were placed on patient and staff, and the laser was fired at 15 watts continuous mode advancing 1-51mm/second for a total of 2290 joules.      Steri strip was applied to the IV insertion site and ABD pads and thigh high compression stockings were applied.  Ace wrap bandages were applied over the right calf and thigh and at the top of the saphenofemoral junction. Blood loss was less than 15 cc.  The  patient ambulated out of the operating room having tolerated the procedure well.

## 2017-03-13 ENCOUNTER — Other Ambulatory Visit: Payer: Self-pay | Admitting: *Deleted

## 2017-03-13 DIAGNOSIS — I83893 Varicose veins of bilateral lower extremities with other complications: Secondary | ICD-10-CM

## 2017-03-19 ENCOUNTER — Encounter: Payer: Self-pay | Admitting: Vascular Surgery

## 2017-03-19 ENCOUNTER — Ambulatory Visit (INDEPENDENT_AMBULATORY_CARE_PROVIDER_SITE_OTHER): Payer: Medicare HMO | Admitting: Vascular Surgery

## 2017-03-19 ENCOUNTER — Ambulatory Visit (HOSPITAL_COMMUNITY)
Admission: RE | Admit: 2017-03-19 | Discharge: 2017-03-19 | Disposition: A | Discharge status: Home / Self Care | Payer: Medicare HMO | Source: Ambulatory Visit | Attending: Vascular Surgery | Admitting: Vascular Surgery

## 2017-03-19 VITALS — BP 152/87 | HR 50 | Temp 99.3°F | Resp 16 | Ht 67.0 in | Wt 230.0 lb

## 2017-03-19 DIAGNOSIS — I83893 Varicose veins of bilateral lower extremities with other complications: Secondary | ICD-10-CM | POA: Diagnosis not present

## 2017-03-19 NOTE — Progress Notes (Signed)
Subjective:     Patient ID: Nicole Mcintosh, female   DOB: [birth year], 66 y.o.   MRN: [id number]  HPI  This 66 year old female returns 1 week post-laser ablation right great saphenous veinfor  gross reflux with painful varicosities. She has worn her long leg elastic compression stockings and take an ibuprofen as instructed. She has had minimal discomfort in the right medial thigh area. She's had no distal edema. She has no specific complaints.  Past Medical History:  Diagnosis Date  . Esophageal reflux   . Hematuria   . Hyperlipidemia   . Hypertension   . Impaired fasting glucose   . RSD (reflex sympathetic dystrophy)   . Varicose veins of both lower extremities     Social History   Tobacco Use  . Smoking status: Former Smoker    Last attempt to quit: 05/14/1978    Years since quitting: 38.8  . Smokeless tobacco: Never Used  Substance Use Topics  . Alcohol use: No    Family History  Problem Relation Age of Onset  . Hyperlipidemia Mother   . Coronary artery disease Mother   . Heart attack Father   . Hypertension Father   . Coronary artery disease Father   . Breast cancer Maternal Aunt     Allergies  Allergen Reactions  . Celebrex [Celecoxib] Swelling  . Cymbalta [Duloxetine Hcl] Other (See Comments)    Weight gain Weight gain  . Lyrica [Pregabalin] Other (See Comments)    Weight gain Weight gain   . Meloxicam Other (See Comments)    Inneffective Inneffective     Current Outpatient Medications:  .  ascorbic acid (VITAMIN C) 1000 MG tablet, Take 1,000 mg by mouth daily., Disp: , Rfl:  .  aspirin EC 81 MG tablet, Take 1 tablet (81 mg total) by mouth daily., Disp: 90 tablet, Rfl: 3 .  calcium citrate-vitamin D (CITRACAL+D) 315-200 MG-UNIT per tablet, Take 1 tablet by mouth daily., Disp: , Rfl:  .  carisoprodol (SOMA) 350 MG tablet, Take 350 mg by mouth 4 (four) times daily as needed for muscle spasms., Disp: , Rfl:  .  Cholecalciferol (D3-1000) 1000 UNITS capsule,  Take 1,000 Units by mouth daily., Disp: , Rfl:  .  folic acid (FOLVITE) 099 MCG tablet, Take 800 mcg by mouth daily. , Disp: , Rfl:  .  HYDROcodone-acetaminophen (NORCO) 10-325 MG per tablet, Take 1 tablet by mouth every 4 (four) hours as needed (pain). , Disp: , Rfl:  .  irbesartan-hydrochlorothiazide (AVALIDE) 300-12.5 MG per tablet, Take 1 tablet by mouth daily., Disp: , Rfl:  .  isosorbide mononitrate (IMDUR) 60 MG 24 hr tablet, Take 1 tablet (60 mg total) by mouth daily., Disp: 30 tablet, Rfl: 5 .  lansoprazole (PREVACID) 30 MG capsule, Take 30 mg by mouth daily as needed (acid reflux). , Disp: , Rfl:  .  lovastatin (MEVACOR) 40 MG tablet, Take 1 tablet (40 mg total) by mouth at bedtime., Disp: , Rfl:  .  metoprolol (LOPRESSOR) 50 MG tablet, Take 50 mg by mouth 2 (two) times daily., Disp: , Rfl:  .  Multiple Vitamin (MULTIVITAMIN WITH MINERALS) TABS tablet, Take 1 tablet by mouth daily. Centrum Silver, Disp: , Rfl:  .  nitroGLYCERIN (NITROSTAT) 0.4 MG SL tablet, Place 1 tablet (0.4 mg total) under the tongue every 5 (five) minutes as needed for chest pain., Disp: 25 tablet, Rfl: 5 .  Omega-3 Fatty Acids (FISH OIL) 1000 MG CAPS, Take 1,000 mg by mouth 2 (two) times  daily., Disp: , Rfl:  .  Probiotic Product (PROBIOTIC PO), Take 1 tablet by mouth at bedtime., Disp: , Rfl:  .  promethazine (PHENERGAN) 25 MG tablet, Take 25 mg by mouth 2 (two) times daily as needed (migraines). , Disp: , Rfl:  .  temazepam (RESTORIL) 30 MG capsule, Take 30 mg by mouth at bedtime as needed for sleep., Disp: , Rfl:   Vitals:   03/19/17 1059 03/19/17 1104  BP: (!) 190/89 (!) 152/87  Pulse: (!) 54 (!) 50  Resp: 16   Temp: 99.3 F (37.4 C)   SpO2: 100%   Weight: 230 lb (104.3 kg)   Height: 5\' 7"  (1.702 m)     Body mass index is 36.02 kg/m.         Review of Systems Denies chest pain, dyspnea on exertion, PND, orthopnea, hemoptysis. Does have history of hypertension and hyperlipidemia.     Objective:   Physical Exam BP (!) 152/87 (BP Location: Left Arm, Patient Position: Sitting, Cuff Size: Large)   Pulse (!) 50   Temp 99.3 F (37.4 C)   Resp 16   Ht 5\' 7"  (1.702 m)   Wt 230 lb (104.3 kg)   SpO2 100%   BMI 36.02 kg/m   Gen. well-developed obese female no apparent distress alert and oriented 3 Lungs no rhonchi or wheezing Cardiovascular regular rhythm no murmurs Right leg with mild discomfort to deep palpation over great saphenous vein in the mid and proximal thigh. No distal edema noted. 3+ dorsalis pedis pulse palpable.  Today I ordered a venous duplex exam the right leg which I reviewed and interpreted. There is no DVT. There is closure of the right great saphenous vein up to near the saphenofemoral junction     Assessment:     Successful laser ablation right great saphenous vein for gross reflux with pain and swelling    Plan:     Patient to return in the near future for similar procedure and contralateral left leg

## 2017-04-01 ENCOUNTER — Encounter: Payer: Self-pay | Admitting: Vascular Surgery

## 2017-04-01 ENCOUNTER — Ambulatory Visit (INDEPENDENT_AMBULATORY_CARE_PROVIDER_SITE_OTHER): Payer: Medicare HMO | Admitting: Vascular Surgery

## 2017-04-01 VITALS — BP 113/66 | HR 57 | Temp 99.1°F | Resp 16 | Ht 67.0 in | Wt 220.0 lb

## 2017-04-01 DIAGNOSIS — I83892 Varicose veins of left lower extremities with other complications: Secondary | ICD-10-CM

## 2017-04-01 HISTORY — PX: ENDOVENOUS ABLATION SAPHENOUS VEIN W/ LASER: SUR449

## 2017-04-01 NOTE — Progress Notes (Signed)
Laser Ablation Procedure    Date: 04/01/2017   Nicole Mcintosh DOB:[birth year]  Consent signed: Yes    Surgeon:  Dr. Nelda Severe. Kellie Simmering  Procedure: Laser Ablation: left Greater Saphenous Vein  BP 113/66 (BP Location: Left Arm, Patient Position: Sitting, Cuff Size: Large)   Pulse (!) 57   Temp 99.1 F (37.3 C) (Oral)   Resp 16   Ht 5\' 7"  (1.702 m)   Wt 220 lb (99.8 kg)   SpO2 95%   BMI 34.46 kg/m   Tumescent Anesthesia: 425 cc 0.9% NaCl with 50 cc Lidocaine HCL and 15 cc 8.4% NaHCO3  Local Anesthesia: 4 cc Lidocaine HCL and NaHCO3 (ratio 2:1)  Pulsed Mode: 15 watts, 556ms delay, 1.0 duration  Total Energy: 2404 Joules             Total Pulses:  161              Total Time: 2:40      Patient tolerated procedure well   Description of Procedure:  After marking the course of the secondary varicosities, the patient was placed on the operating table in the supine position, and the left leg was prepped and draped in sterile fashion.   Local anesthetic was administered and under ultrasound guidance the saphenous vein was accessed with a micro needle and guide wire; then the mirco puncture sheath was placed.  A guide wire was inserted saphenofemoral junction , followed by a 5 french sheath.  The position of the sheath and then the laser fiber below the junction was confirmed using the ultrasound.  Tumescent anesthesia was administered along the course of the saphenous vein using ultrasound guidance. The patient was placed in Trendelenburg position and protective laser glasses were placed on patient and staff, and the laser was fired at 15 watts continuous mode advancing 1-38mm/second for a total of 2404 joules.     Steri strip was applied to the IV insertion site and ABD pads and thigh high compression stockings were applied.  Ace wrap bandages were applied over the left thigh and calf and at the top of the saphenofemoral junction. Blood loss was less than 15 cc.  The patient ambulated out  of the operating room having tolerated the procedure well.

## 2017-04-01 NOTE — Progress Notes (Signed)
Blood pressure 113/66, pulse (!) 57, temperature 99.1 F (37.3 C), temperature source Oral, resp. rate 16, height 5\' 7"  (1.702 m), weight 220 lb (99.8 kg), SpO2 95 %.

## 2017-04-01 NOTE — Progress Notes (Signed)
Subjective:     Patient ID: Nicole Mcintosh, female   DOB: 01/22/1951, 66 y.o.   MRN: [id number]  HPI This 66 year old female had laser ablation left great saphenous vein from the proximal calf to near the saphenofemoral junction performed under local tumescent anesthesia. She tolerated the procedure well. A total of 2404 J of energy was utilized.  Review of Systems     Objective:   Physical Exam BP 113/66 (BP Location: Left Arm, Patient Position: Sitting, Cuff Size: Large)   Pulse (!) 57   Temp 99.1 F (37.3 C) (Oral)   Resp 16   Ht 5\' 7"  (1.702 m)   Wt 220 lb (99.8 kg)   SpO2 95%   BMI 34.46 kg/m        Assessment:     Well-tolerated laser ablation left great saphenous vein performed under local tumescent anesthesia    Plan:     Return in 1 week for venous duplex exam to confirm closure left great saphenous vein

## 2017-04-02 ENCOUNTER — Encounter: Payer: Self-pay | Admitting: Vascular Surgery

## 2017-04-09 ENCOUNTER — Encounter: Payer: Self-pay | Admitting: Vascular Surgery

## 2017-04-09 ENCOUNTER — Ambulatory Visit (HOSPITAL_COMMUNITY)
Admission: RE | Admit: 2017-04-09 | Discharge: 2017-04-09 | Disposition: A | Discharge status: Home / Self Care | Payer: Medicare HMO | Source: Ambulatory Visit | Attending: Vascular Surgery | Admitting: Vascular Surgery

## 2017-04-09 ENCOUNTER — Ambulatory Visit (INDEPENDENT_AMBULATORY_CARE_PROVIDER_SITE_OTHER): Payer: Medicare HMO | Admitting: Vascular Surgery

## 2017-04-09 VITALS — BP 185/88 | HR 50 | Temp 98.5°F | Resp 18 | Ht 67.0 in | Wt 242.0 lb

## 2017-04-09 DIAGNOSIS — I83893 Varicose veins of bilateral lower extremities with other complications: Secondary | ICD-10-CM

## 2017-04-09 DIAGNOSIS — Z9889 Other specified postprocedural states: Secondary | ICD-10-CM | POA: Diagnosis not present

## 2017-04-09 NOTE — Progress Notes (Signed)
Subjective:     Patient ID: Nicole Mcintosh, female   DOB: 15-Aug-1950, 66 y.o.   MRN: [id number]  HPI This 66 year old female returns 1 week post-laser ablation left great saphenous vein for pain and swelling and varicosities. She has had some mild-to-moderate discomfort in the medial thighs one would expect which is resolving. She has taken her ibuprofen and worn her elastic compression stockings as instructed. Right leg continues to feel well after procedure performed a few weeks earlier.  Past Medical History:  Diagnosis Date  . Esophageal reflux   . Hematuria   . Hyperlipidemia   . Hypertension   . Impaired fasting glucose   . RSD (reflex sympathetic dystrophy)   . Varicose veins of both lower extremities     Social History   Tobacco Use  . Smoking status: Former Smoker    Last attempt to quit: 05/14/1978    Years since quitting: 38.9  . Smokeless tobacco: Never Used  Substance Use Topics  . Alcohol use: No    Family History  Problem Relation Age of Onset  . Hyperlipidemia Mother   . Coronary artery disease Mother   . Heart attack Father   . Hypertension Father   . Coronary artery disease Father   . Breast cancer Maternal Aunt     Allergies  Allergen Reactions  . Celebrex [Celecoxib] Swelling  . Cymbalta [Duloxetine Hcl] Other (See Comments)    Weight gain Weight gain  . Lyrica [Pregabalin] Other (See Comments)    Weight gain Weight gain   . Meloxicam Other (See Comments)    Inneffective Inneffective     Current Outpatient Medications:  .  ascorbic acid (VITAMIN C) 1000 MG tablet, Take 1,000 mg by mouth daily., Disp: , Rfl:  .  aspirin EC 81 MG tablet, Take 1 tablet (81 mg total) by mouth daily., Disp: 90 tablet, Rfl: 3 .  calcium citrate-vitamin D (CITRACAL+D) 315-200 MG-UNIT per tablet, Take 1 tablet by mouth daily., Disp: , Rfl:  .  carisoprodol (SOMA) 350 MG tablet, Take 350 mg by mouth 4 (four) times daily as needed for muscle spasms., Disp: , Rfl:   .  Cholecalciferol (D3-1000) 1000 UNITS capsule, Take 1,000 Units by mouth daily., Disp: , Rfl:  .  folic acid (FOLVITE) 580 MCG tablet, Take 800 mcg by mouth daily. , Disp: , Rfl:  .  HYDROcodone-acetaminophen (NORCO) 10-325 MG per tablet, Take 1 tablet by mouth every 4 (four) hours as needed (pain). , Disp: , Rfl:  .  irbesartan-hydrochlorothiazide (AVALIDE) 300-12.5 MG per tablet, Take 1 tablet by mouth daily., Disp: , Rfl:  .  isosorbide mononitrate (IMDUR) 60 MG 24 hr tablet, Take 1 tablet (60 mg total) by mouth daily., Disp: 30 tablet, Rfl: 5 .  lansoprazole (PREVACID) 30 MG capsule, Take 30 mg by mouth daily as needed (acid reflux). , Disp: , Rfl:  .  lovastatin (MEVACOR) 40 MG tablet, Take 1 tablet (40 mg total) by mouth at bedtime., Disp: , Rfl:  .  metoprolol (LOPRESSOR) 50 MG tablet, Take 50 mg by mouth 2 (two) times daily., Disp: , Rfl:  .  Multiple Vitamin (MULTIVITAMIN WITH MINERALS) TABS tablet, Take 1 tablet by mouth daily. Centrum Silver, Disp: , Rfl:  .  nitroGLYCERIN (NITROSTAT) 0.4 MG SL tablet, Place 1 tablet (0.4 mg total) under the tongue every 5 (five) minutes as needed for chest pain., Disp: 25 tablet, Rfl: 5 .  Omega-3 Fatty Acids (FISH OIL) 1000 MG CAPS, Take 1,000 mg  by mouth 2 (two) times daily., Disp: , Rfl:  .  Probiotic Product (PROBIOTIC PO), Take 1 tablet by mouth at bedtime., Disp: , Rfl:  .  promethazine (PHENERGAN) 25 MG tablet, Take 25 mg by mouth 2 (two) times daily as needed (migraines). , Disp: , Rfl:  .  temazepam (RESTORIL) 30 MG capsule, Take 30 mg by mouth at bedtime as needed for sleep., Disp: , Rfl:   Vitals:   04/09/17 1037 04/09/17 1039  BP: (!) 186/86 (!) 185/88  Pulse: (!) 50   Resp: 18   Temp: 98.5 F (36.9 C)   TempSrc: Oral   SpO2: 96%   Weight: 242 lb (109.8 kg)   Height: 5\' 7"  (1.702 m)     Body mass index is 37.9 kg/m.         Review of Systems Denies chest pain, dyspnea on exertion, PND, orthopnea, hemoptysis,  claudication    Objective:   Physical Exam BP (!) 185/88 Comment: recheck  Pulse (!) 50   Temp 98.5 F (36.9 C) (Oral)   Resp 18   Ht 5\' 7"  (1.702 m)   Wt 242 lb (109.8 kg)   SpO2 96%   BMI 37.90 kg/m   Gen. well-developed obese female no apparent distress alert and oriented 3 Lungs no rhonchi or wheezing Left leg with mild discomfort to deep palpation over great saphenous vein from the knee to near the inguinal crease. No bruising or hematoma palpable. Minimal distal edema with 3+ dorsalis pedis pulse palpable.  Today I ordered a venous duplex exam the left leg which I reviewed and interpreted. There is a DVT. There is closure left great saphenous vein up to near the saphenofemoral junction.     Assessment:     Successful laser ablation left great saphenous vein and right great saphenous vein for pain and swelling and varicosities    Plan:     Patient to return on December 21 for  foam sclerotherapy to complete her treatment regimen

## 2017-04-22 ENCOUNTER — Ambulatory Visit: Payer: Medicare HMO | Admitting: *Deleted

## 2017-05-03 ENCOUNTER — Ambulatory Visit: Payer: Medicare HMO | Admitting: *Deleted

## 2017-05-22 DIAGNOSIS — I10 Essential (primary) hypertension: Secondary | ICD-10-CM | POA: Diagnosis not present

## 2017-05-22 DIAGNOSIS — N3281 Overactive bladder: Secondary | ICD-10-CM | POA: Diagnosis not present

## 2017-05-22 DIAGNOSIS — K219 Gastro-esophageal reflux disease without esophagitis: Secondary | ICD-10-CM | POA: Diagnosis not present

## 2017-05-22 DIAGNOSIS — Z1159 Encounter for screening for other viral diseases: Secondary | ICD-10-CM | POA: Diagnosis not present

## 2017-05-22 DIAGNOSIS — G9050 Complex regional pain syndrome I, unspecified: Secondary | ICD-10-CM | POA: Diagnosis not present

## 2017-05-22 DIAGNOSIS — R7301 Impaired fasting glucose: Secondary | ICD-10-CM | POA: Diagnosis not present

## 2017-05-22 DIAGNOSIS — N951 Menopausal and female climacteric states: Secondary | ICD-10-CM | POA: Diagnosis not present

## 2017-05-22 DIAGNOSIS — I83891 Varicose veins of right lower extremities with other complications: Secondary | ICD-10-CM | POA: Diagnosis not present

## 2017-05-22 DIAGNOSIS — E782 Mixed hyperlipidemia: Secondary | ICD-10-CM | POA: Diagnosis not present

## 2017-05-22 DIAGNOSIS — Z0000 Encounter for general adult medical examination without abnormal findings: Secondary | ICD-10-CM | POA: Diagnosis not present

## 2017-05-30 DIAGNOSIS — D126 Benign neoplasm of colon, unspecified: Secondary | ICD-10-CM | POA: Diagnosis not present

## 2017-05-30 DIAGNOSIS — K219 Gastro-esophageal reflux disease without esophagitis: Secondary | ICD-10-CM | POA: Diagnosis not present

## 2017-07-22 DIAGNOSIS — K635 Polyp of colon: Secondary | ICD-10-CM | POA: Diagnosis not present

## 2017-07-22 DIAGNOSIS — D126 Benign neoplasm of colon, unspecified: Secondary | ICD-10-CM | POA: Diagnosis not present

## 2017-07-22 DIAGNOSIS — Z86010 Personal history of colonic polyps: Secondary | ICD-10-CM | POA: Diagnosis not present

## 2017-07-24 DIAGNOSIS — D126 Benign neoplasm of colon, unspecified: Secondary | ICD-10-CM | POA: Diagnosis not present

## 2017-07-24 DIAGNOSIS — K635 Polyp of colon: Secondary | ICD-10-CM | POA: Diagnosis not present

## 2017-07-25 DIAGNOSIS — Z780 Asymptomatic menopausal state: Secondary | ICD-10-CM | POA: Diagnosis not present

## 2017-08-15 ENCOUNTER — Other Ambulatory Visit: Payer: Self-pay | Admitting: Family Medicine

## 2017-08-15 DIAGNOSIS — Z1231 Encounter for screening mammogram for malignant neoplasm of breast: Secondary | ICD-10-CM

## 2017-09-11 ENCOUNTER — Other Ambulatory Visit: Payer: Self-pay | Admitting: *Deleted

## 2017-09-11 ENCOUNTER — Ambulatory Visit
Admission: RE | Admit: 2017-09-11 | Discharge: 2017-09-11 | Disposition: A | Discharge status: Home / Self Care | Payer: Medicare HMO | Source: Ambulatory Visit | Attending: Family Medicine | Admitting: Family Medicine

## 2017-09-11 DIAGNOSIS — I83893 Varicose veins of bilateral lower extremities with other complications: Secondary | ICD-10-CM

## 2017-09-11 DIAGNOSIS — Z1231 Encounter for screening mammogram for malignant neoplasm of breast: Secondary | ICD-10-CM

## 2017-09-13 ENCOUNTER — Ambulatory Visit (HOSPITAL_COMMUNITY)
Admission: RE | Admit: 2017-09-13 | Discharge: 2017-09-13 | Disposition: A | Discharge status: Home / Self Care | Payer: Medicare HMO | Source: Ambulatory Visit | Attending: Vascular Surgery | Admitting: Vascular Surgery

## 2017-09-13 DIAGNOSIS — I872 Venous insufficiency (chronic) (peripheral): Secondary | ICD-10-CM | POA: Diagnosis not present

## 2017-09-13 DIAGNOSIS — M79604 Pain in right leg: Secondary | ICD-10-CM | POA: Diagnosis not present

## 2017-09-13 DIAGNOSIS — I83893 Varicose veins of bilateral lower extremities with other complications: Secondary | ICD-10-CM | POA: Diagnosis not present

## 2017-09-13 DIAGNOSIS — M79605 Pain in left leg: Secondary | ICD-10-CM | POA: Diagnosis not present

## 2017-09-13 DIAGNOSIS — M7989 Other specified soft tissue disorders: Secondary | ICD-10-CM | POA: Diagnosis not present

## 2017-09-16 ENCOUNTER — Other Ambulatory Visit: Payer: Self-pay

## 2017-09-16 ENCOUNTER — Ambulatory Visit (INDEPENDENT_AMBULATORY_CARE_PROVIDER_SITE_OTHER): Payer: Medicare HMO | Admitting: Vascular Surgery

## 2017-09-16 ENCOUNTER — Encounter: Payer: Self-pay | Admitting: Vascular Surgery

## 2017-09-16 VITALS — BP 186/110 | HR 82 | Resp 18 | Ht 67.0 in | Wt 240.0 lb

## 2017-09-16 DIAGNOSIS — I83893 Varicose veins of bilateral lower extremities with other complications: Secondary | ICD-10-CM | POA: Diagnosis not present

## 2017-09-16 NOTE — Progress Notes (Signed)
Subjective:     Patient ID: Nicole Mcintosh, female   DOB: [birth year], 67 y.o.   MRN: [id number]  HPI This 67 year old female is known to me having undergone bilateral great saphenous vein laser ablation procedures in 2018 for gross reflux with pain and swelling bilaterally and an episode of spontaneous bleeding in the right ankle preoperatively.  She was shown to have successful ablation of both great saphenous veins with no DVT in her follow-up appointments.  Since December she has been experiencing occasional episodes of severe cramping and discomfort in both lower extremities thighs greater than calves which extend down to the feet.  She has had no worsening of her edema.  She does wear elastic compression stockings.  She has a remote history of a fractured coccyx when she was 67 years old and does have chronic back pain.  She has no history of ruptured lumbar disc.  Symptoms she state are quite severe.  Past Medical History:  Diagnosis Date  . Esophageal reflux   . Hematuria   . Hyperlipidemia   . Hypertension   . Impaired fasting glucose   . RSD (reflex sympathetic dystrophy)   . Varicose veins of both lower extremities     Social History   Tobacco Use  . Smoking status: Former Smoker    Last attempt to quit: 05/14/1978    Years since quitting: 39.3  . Smokeless tobacco: Never Used  Substance Use Topics  . Alcohol use: No    Family History  Problem Relation Age of Onset  . Hyperlipidemia Mother   . Coronary artery disease Mother   . Heart attack Father   . Hypertension Father   . Coronary artery disease Father   . Breast cancer Maternal Aunt     Allergies  Allergen Reactions  . Celebrex [Celecoxib] Swelling  . Cymbalta [Duloxetine Hcl] Other (See Comments)    Weight gain Weight gain  . Lyrica [Pregabalin] Other (See Comments)    Weight gain Weight gain   . Meloxicam Other (See Comments)    Inneffective Inneffective     Current Outpatient Medications:  .   ascorbic acid (VITAMIN C) 1000 MG tablet, Take 1,000 mg by mouth daily., Disp: , Rfl:  .  aspirin EC 81 MG tablet, Take 1 tablet (81 mg total) by mouth daily., Disp: 90 tablet, Rfl: 3 .  calcium citrate-vitamin D (CITRACAL+D) 315-200 MG-UNIT per tablet, Take 1 tablet by mouth daily., Disp: , Rfl:  .  carisoprodol (SOMA) 350 MG tablet, Take 350 mg by mouth 4 (four) times daily as needed for muscle spasms., Disp: , Rfl:  .  Cholecalciferol (D3-1000) 1000 UNITS capsule, Take 1,000 Units by mouth daily., Disp: , Rfl:  .  folic acid (FOLVITE) 443 MCG tablet, Take 800 mcg by mouth daily. , Disp: , Rfl:  .  HYDROcodone-acetaminophen (NORCO) 10-325 MG per tablet, Take 1 tablet by mouth every 4 (four) hours as needed (pain). , Disp: , Rfl:  .  irbesartan-hydrochlorothiazide (AVALIDE) 300-12.5 MG per tablet, Take 1 tablet by mouth daily., Disp: , Rfl:  .  isosorbide mononitrate (IMDUR) 60 MG 24 hr tablet, Take 1 tablet (60 mg total) by mouth daily., Disp: 30 tablet, Rfl: 5 .  lansoprazole (PREVACID) 30 MG capsule, Take 30 mg by mouth daily as needed (acid reflux). , Disp: , Rfl:  .  lovastatin (MEVACOR) 40 MG tablet, Take 1 tablet (40 mg total) by mouth at bedtime., Disp: , Rfl:  .  metoprolol (LOPRESSOR) 50 MG  tablet, Take 50 mg by mouth 2 (two) times daily., Disp: , Rfl:  .  Multiple Vitamin (MULTIVITAMIN WITH MINERALS) TABS tablet, Take 1 tablet by mouth daily. Centrum Silver, Disp: , Rfl:  .  nitroGLYCERIN (NITROSTAT) 0.4 MG SL tablet, Place 1 tablet (0.4 mg total) under the tongue every 5 (five) minutes as needed for chest pain., Disp: 25 tablet, Rfl: 5 .  Omega-3 Fatty Acids (FISH OIL) 1000 MG CAPS, Take 1,000 mg by mouth 2 (two) times daily., Disp: , Rfl:  .  Probiotic Product (PROBIOTIC PO), Take 1 tablet by mouth at bedtime., Disp: , Rfl:  .  promethazine (PHENERGAN) 25 MG tablet, Take 25 mg by mouth 2 (two) times daily as needed (migraines). , Disp: , Rfl:  .  temazepam (RESTORIL) 30 MG capsule,  Take 30 mg by mouth at bedtime as needed for sleep., Disp: , Rfl:   Vitals:   09/16/17 1007 09/16/17 1008  BP: (!) 189/98 (!) 186/110  Pulse: 82   Resp: 18   SpO2: 94%   Weight: 240 lb (108.9 kg)   Height: 5\' 7"  (1.702 m)     Body mass index is 37.59 kg/m.         Review of Systems Denies chest pain, dyspnea on exertion, PND, orthopnea, hemoptysis    Objective:   Physical Exam BP (!) 186/110 Comment: recheck  Pulse 82   Resp 18   Ht 5\' 7"  (1.702 m)   Wt 240 lb (108.9 kg)   SpO2 94%   BMI 37.59 kg/m   General obese female no apparent distress alert and oriented x3 Lungs no rhonchi or wheezing Both lower extremities with 2-3+ dorsalis pedis pulse palpable and no distal edema noted.  No bulging varicosities noted.  Patient very hypersensitive to touch in the medial proximal thigh areas.  Patient had a bilateral venous ultrasound performed a few days ago in our lab which I reviewed.  Patient has total closure of bilateral great saphenous veins up to near the saphenofemoral junction with no DVT bilaterally.  She does have some deep vein reflux which is unchanged from previous studies.     Assessment:     Status post successful bilateral laser ablation great saphenous veins for pain and swelling with history of bleeding spontaneously from varix in the right ankle-has not reoccurred  Episodes of bilateral thigh and calf leg cramping described as quite severe by the patient-etiology unknown     Plan:     There is no vascular etiology to account for her symptoms and her laser ablation procedures continue to be successful with closure of the superficial great saphenous system bilaterally Would consider neurosurgical evaluation for possible nerve compression syndrome No need for any further vascular evaluation

## 2017-09-20 ENCOUNTER — Other Ambulatory Visit: Payer: Self-pay | Admitting: Family Medicine

## 2017-09-20 DIAGNOSIS — R569 Unspecified convulsions: Secondary | ICD-10-CM | POA: Diagnosis not present

## 2017-09-20 DIAGNOSIS — R51 Headache: Secondary | ICD-10-CM | POA: Diagnosis not present

## 2017-09-20 DIAGNOSIS — R519 Headache, unspecified: Secondary | ICD-10-CM

## 2017-09-20 DIAGNOSIS — R252 Cramp and spasm: Secondary | ICD-10-CM | POA: Diagnosis not present

## 2017-09-23 ENCOUNTER — Encounter: Payer: Self-pay | Admitting: Neurology

## 2017-09-25 ENCOUNTER — Other Ambulatory Visit: Payer: Medicare HMO

## 2017-09-25 DIAGNOSIS — M65312 Trigger thumb, left thumb: Secondary | ICD-10-CM | POA: Diagnosis not present

## 2017-09-27 ENCOUNTER — Ambulatory Visit
Admission: RE | Admit: 2017-09-27 | Discharge: 2017-09-27 | Disposition: A | Discharge status: Home / Self Care | Payer: Medicare HMO | Source: Ambulatory Visit | Attending: Family Medicine | Admitting: Family Medicine

## 2017-09-27 DIAGNOSIS — R51 Headache: Principal | ICD-10-CM

## 2017-09-27 DIAGNOSIS — R519 Headache, unspecified: Secondary | ICD-10-CM

## 2017-09-27 MED ORDER — IOPAMIDOL (ISOVUE-300) INJECTION 61%
75.0000 mL | Freq: Once | INTRAVENOUS | Status: AC | PRN
  Administered 2017-09-27: 75 mL via INTRAVENOUS

## 2017-10-03 ENCOUNTER — Ambulatory Visit
Admission: RE | Admit: 2017-10-03 | Discharge: 2017-10-03 | Disposition: A | Discharge status: Home / Self Care | Payer: Medicare HMO | Source: Ambulatory Visit | Attending: Family Medicine | Admitting: Family Medicine

## 2017-10-03 ENCOUNTER — Ambulatory Visit: Payer: Medicare HMO

## 2017-10-03 DIAGNOSIS — Z1231 Encounter for screening mammogram for malignant neoplasm of breast: Secondary | ICD-10-CM | POA: Diagnosis not present

## 2017-11-07 ENCOUNTER — Ambulatory Visit (INDEPENDENT_AMBULATORY_CARE_PROVIDER_SITE_OTHER): Payer: Medicare HMO | Admitting: Neurology

## 2017-11-07 ENCOUNTER — Encounter: Payer: Self-pay | Admitting: Neurology

## 2017-11-07 ENCOUNTER — Telehealth: Payer: Self-pay | Admitting: *Deleted

## 2017-11-07 VITALS — BP 140/70 | HR 57 | Ht 67.0 in | Wt 241.0 lb

## 2017-11-07 DIAGNOSIS — M791 Myalgia, unspecified site: Secondary | ICD-10-CM

## 2017-11-07 DIAGNOSIS — M5441 Lumbago with sciatica, right side: Secondary | ICD-10-CM | POA: Diagnosis not present

## 2017-11-07 DIAGNOSIS — G8929 Other chronic pain: Secondary | ICD-10-CM

## 2017-11-07 DIAGNOSIS — M5442 Lumbago with sciatica, left side: Secondary | ICD-10-CM | POA: Diagnosis not present

## 2017-11-07 MED ORDER — GABAPENTIN 300 MG PO CAPS: 300.0000 mg | ORAL_CAPSULE | Freq: Three times a day (TID) | ORAL | 0 refills | Status: AC

## 2017-11-07 MED ORDER — GABAPENTIN 300 MG PO CAPS: ORAL_CAPSULE | ORAL | 0 refills | Status: DC
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
## 2017-11-07 NOTE — Telephone Encounter (Signed)
Called [facility], spoke with [employer], gave verbal. Called Pt to advise, no answer.

## 2017-11-07 NOTE — Telephone Encounter (Signed)
Patient called stating her medication went to the wrong pharmacy, patient said it needs to go [facility] [address] [place] [phone]

## 2017-11-07 NOTE — Addendum Note (Signed)
Addended by: Clois Comber on: 11/07/2017 10:07 AM   Modules accepted: Orders

## 2017-11-07 NOTE — Patient Instructions (Addendum)
1.  Start gabapentin 300mg : Take 1 capsule at bedtime for 7 days, then 1 capsule twice daily for 7 days, then 1 capsule three times daily 2.  MRI of lumbar spine 3.  Follow up in 3 months  We have sent a referral to [facility] for your MRI and they will call you directly to schedule your appt. They are located at [place]. If you need to contact them directly please call [phone].

## 2017-11-12 ENCOUNTER — Telehealth: Payer: Self-pay | Admitting: Neurology

## 2017-11-12 NOTE — Telephone Encounter (Signed)
Called and spoke with Pt, advised her the telephone number is listed on her AVS for MRI. Pt will call to schedule.

## 2017-11-12 NOTE — Telephone Encounter (Signed)
Nicole Mcintosh Self [phone]  [facility] called to see what was going on with her being schedule for MRI and Nerve Conduction test.

## 2017-11-13 ENCOUNTER — Telehealth: Payer: Self-pay | Admitting: Neurology

## 2017-11-13 NOTE — Telephone Encounter (Signed)
[place] Self [phone]  Nicole Mcintosh called to say that she has been taking the gabapentin (NEURONTIN) 300 MG capsule  as prescribed, but it is making her very nauseas, so much that some days she is having to eat saltine crackers before she can get out of bed. During the day she has spurts of severe nausea, she is very concerned about increasing the dosage when she is suppose to. Nicole Mcintosh also said that Dr Brigitte Pulse had given her lorazepam in the past and it had worked without side effects. Please call back and advise.

## 2017-11-15 MED ORDER — GABAPENTIN 100 MG PO CAPS: 100.0000 mg | ORAL_CAPSULE | Freq: Every day | ORAL | 0 refills | Status: AC

## 2017-11-15 NOTE — Telephone Encounter (Signed)
Patient states that the Gabapentin is making her sick she states that she is having a lot back pain and nausea (havent thrown up yet ). She states that no one has called her back since her last message on 11-13-17 please call patient.  CB# [phone]

## 2017-11-15 NOTE — Telephone Encounter (Signed)
She may need to start on a lower dose.  We can prescribe her 100mg .  She may take 1 at bedtime for 7 days, then increase to 2 at bedtime for 7 days, then increase to 3 at bedtime.  If a lower dose is effective, she may remain on that dose.

## 2017-11-15 NOTE — Telephone Encounter (Signed)
Called and spoke with Pt, advised her of lower dose. She will call if any additional issues.

## 2017-11-15 NOTE — Telephone Encounter (Signed)
Rcvd VM from [place] at [facility]. She was questioning new Rx for 100mg , when Pt was previously on 300mg . Called and spoke with Anderson Malta, advised her we decreased her dose to 100mg  x7 days, then 200mg  x7, then 300.

## 2017-11-16 ENCOUNTER — Inpatient Hospital Stay: Admission: RE | Admit: 2017-11-16 | Payer: Medicare HMO | Source: Ambulatory Visit

## 2017-11-20 ENCOUNTER — Other Ambulatory Visit: Payer: Medicare HMO

## 2017-11-21 ENCOUNTER — Telehealth: Payer: Self-pay | Admitting: Neurology

## 2017-11-21 NOTE — Telephone Encounter (Signed)
Called and spoke with Pt, advised her MRI has been approved. I advised her the issue was with her insurance company as they already had the notes from Dr Tomi Likens indicating need for MRI. I explained that [employer] had called twice and did not hold long enough to get Dr Tomi Likens to the phone.  We called them and set up an appointment for them to call back today. Dr Tomi Likens spoke with Dr. Devona Konig, who after reading the notes, immediately provided PA# [id number]  Pt states she has had a bad experience with [place], noting that when she called for a medication to help her with the pain she is experiencing, she was made to feel as if she was seeking drugs, and having to cancel her MRI 3X.  I advised the Pt we do not change Dr's within the practice, and if she wanted a female Dr. I would have to refer her to another practice. Pt was emotional and weeping, stating the pain in her back is more than she can take and just needs relief. I told the Pt I understand, and that being in constant pain can be trying indeed. She thanked [place] for getting the PA. I provided the Pt with my direct number should she have any additional problems.   I called [facility] and provided Meriam with the PA#. She is to call Pt and try to schedule sooner than 11/26/17

## 2017-11-21 NOTE — Telephone Encounter (Signed)
Patient called very upset with this office. Patient states she had to cancel her MRI for the third time yesterday 7.10.19 due to [facility] not speaking with her insurance company. Pt wanting to speak with nurse and wants to schedule ov with female doc. Please advise.

## 2017-11-26 ENCOUNTER — Encounter

## 2017-11-26 ENCOUNTER — Ambulatory Visit
Admission: RE | Admit: 2017-11-26 | Discharge: 2017-11-26 | Disposition: A | Discharge status: Home / Self Care | Payer: Medicare HMO | Source: Ambulatory Visit | Attending: Neurology | Admitting: Neurology

## 2017-11-26 ENCOUNTER — Ambulatory Visit: Payer: Medicare HMO | Admitting: Neurology

## 2017-11-26 DIAGNOSIS — M791 Myalgia, unspecified site: Secondary | ICD-10-CM

## 2017-11-26 DIAGNOSIS — M5441 Lumbago with sciatica, right side: Principal | ICD-10-CM

## 2017-11-26 DIAGNOSIS — M48061 Spinal stenosis, lumbar region without neurogenic claudication: Secondary | ICD-10-CM | POA: Diagnosis not present

## 2017-11-26 DIAGNOSIS — G8929 Other chronic pain: Secondary | ICD-10-CM

## 2017-11-26 DIAGNOSIS — M5442 Lumbago with sciatica, left side: Principal | ICD-10-CM

## 2017-11-27 ENCOUNTER — Telehealth: Payer: Self-pay | Admitting: Neurology

## 2017-11-27 NOTE — Telephone Encounter (Signed)
Patient called and made her 3 Month Follow up from when she was seen in June. She is scheduled for November and on wait list. She is not wanting to wait until then. She also had her MRI last night. Please Call. Thanks

## 2017-11-27 NOTE — Telephone Encounter (Signed)
Called and spoke with Pt. I advised her we can place her on our wait list and there should not be a problem getting her in for an appt in September.   Pt wanted to know when she will hear about MRI. I advised her once Dr Tomi Likens reviews the images, he will release the report to me, and I will call her.

## 2017-12-06 ENCOUNTER — Telehealth: Payer: Self-pay

## 2017-12-06 DIAGNOSIS — M5441 Lumbago with sciatica, right side: Principal | ICD-10-CM

## 2017-12-06 DIAGNOSIS — G8929 Other chronic pain: Secondary | ICD-10-CM

## 2017-12-06 DIAGNOSIS — M5442 Lumbago with sciatica, left side: Principal | ICD-10-CM

## 2017-12-06 MED ORDER — PREGABALIN 75 MG PO CAPS: 75.0000 mg | ORAL_CAPSULE | Freq: Two times a day (BID) | ORAL | 3 refills | Status: AC

## 2017-12-06 NOTE — Telephone Encounter (Signed)
Called and spoke with Pt, advised her to stop gabapentin, and Dr Tomi Likens has recommended Lyrica 75 mg BID.

## 2017-12-06 NOTE — Addendum Note (Signed)
Addended by: Clois Comber on: 12/06/2017 11:47 AM   Modules accepted: Orders

## 2017-12-06 NOTE — Telephone Encounter (Signed)
-----   Message from Pieter Partridge, DO sent at 11/29/2017  7:53 AM EDT ----- Imaging shows degenerative changes that is likely causing her pain, but I don't think it is causing the jerking episodes.  I would continue with the gabapentin and we can titrate as needed/tolerated.  Other options in the future for back pain would be seeing a spine specialist.

## 2017-12-06 NOTE — Telephone Encounter (Signed)
Called and spoke with Pt, advised her of MRI results.   We were discussing the gabapentin, and she is still having issues with nausea. She would like Dr Georgie Chard opinion on another medication that may help.   She would like to be referred to a spine specialist.

## 2017-12-06 NOTE — Telephone Encounter (Signed)
She can try Lyrica 75mg  twice daily

## 2017-12-26 DIAGNOSIS — Z6837 Body mass index (BMI) 37.0-37.9, adult: Secondary | ICD-10-CM | POA: Diagnosis not present

## 2017-12-26 DIAGNOSIS — M5126 Other intervertebral disc displacement, lumbar region: Secondary | ICD-10-CM | POA: Diagnosis not present

## 2017-12-26 DIAGNOSIS — I10 Essential (primary) hypertension: Secondary | ICD-10-CM | POA: Diagnosis not present

## 2018-01-06 DIAGNOSIS — M5126 Other intervertebral disc displacement, lumbar region: Secondary | ICD-10-CM | POA: Diagnosis not present

## 2018-01-06 DIAGNOSIS — R2689 Other abnormalities of gait and mobility: Secondary | ICD-10-CM | POA: Diagnosis not present

## 2018-01-06 DIAGNOSIS — M5117 Intervertebral disc disorders with radiculopathy, lumbosacral region: Secondary | ICD-10-CM | POA: Diagnosis not present

## 2018-01-06 DIAGNOSIS — R29898 Other symptoms and signs involving the musculoskeletal system: Secondary | ICD-10-CM | POA: Diagnosis not present

## 2018-01-14 DIAGNOSIS — M5441 Lumbago with sciatica, right side: Secondary | ICD-10-CM | POA: Diagnosis not present

## 2018-01-14 DIAGNOSIS — G8929 Other chronic pain: Secondary | ICD-10-CM | POA: Diagnosis not present

## 2018-01-14 DIAGNOSIS — M5442 Lumbago with sciatica, left side: Secondary | ICD-10-CM | POA: Diagnosis not present

## 2018-01-14 DIAGNOSIS — M5126 Other intervertebral disc displacement, lumbar region: Secondary | ICD-10-CM | POA: Diagnosis not present

## 2018-01-21 ENCOUNTER — Ambulatory Visit: Payer: Medicare HMO | Admitting: Neurology
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
## 2018-01-22 DIAGNOSIS — M5430 Sciatica, unspecified side: Secondary | ICD-10-CM | POA: Diagnosis not present

## 2018-01-22 DIAGNOSIS — Z6839 Body mass index (BMI) 39.0-39.9, adult: Secondary | ICD-10-CM | POA: Diagnosis not present

## 2018-01-22 DIAGNOSIS — R7301 Impaired fasting glucose: Secondary | ICD-10-CM | POA: Diagnosis not present

## 2018-01-22 DIAGNOSIS — I10 Essential (primary) hypertension: Secondary | ICD-10-CM | POA: Diagnosis not present

## 2018-01-22 DIAGNOSIS — E782 Mixed hyperlipidemia: Secondary | ICD-10-CM | POA: Diagnosis not present

## 2018-01-23 DIAGNOSIS — Z6838 Body mass index (BMI) 38.0-38.9, adult: Secondary | ICD-10-CM | POA: Diagnosis not present

## 2018-01-23 DIAGNOSIS — M542 Cervicalgia: Secondary | ICD-10-CM | POA: Diagnosis not present

## 2018-01-23 DIAGNOSIS — M5440 Lumbago with sciatica, unspecified side: Secondary | ICD-10-CM | POA: Diagnosis not present
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
## 2018-01-29 ENCOUNTER — Encounter: Payer: Self-pay | Admitting: Neurology

## 2018-01-29 ENCOUNTER — Ambulatory Visit (INDEPENDENT_AMBULATORY_CARE_PROVIDER_SITE_OTHER): Payer: Medicare HMO | Admitting: Neurology

## 2018-01-29 ENCOUNTER — Other Ambulatory Visit (INDEPENDENT_AMBULATORY_CARE_PROVIDER_SITE_OTHER): Payer: Medicare HMO

## 2018-01-29 ENCOUNTER — Other Ambulatory Visit: Payer: Self-pay | Admitting: Neurosurgery

## 2018-01-29 VITALS — BP 108/68 | HR 57 | Ht 67.0 in | Wt 244.0 lb

## 2018-01-29 DIAGNOSIS — M5441 Lumbago with sciatica, right side: Secondary | ICD-10-CM

## 2018-01-29 DIAGNOSIS — R252 Cramp and spasm: Secondary | ICD-10-CM

## 2018-01-29 DIAGNOSIS — M5442 Lumbago with sciatica, left side: Secondary | ICD-10-CM

## 2018-01-29 DIAGNOSIS — R6889 Other general symptoms and signs: Secondary | ICD-10-CM

## 2018-01-29 DIAGNOSIS — M5440 Lumbago with sciatica, unspecified side: Secondary | ICD-10-CM

## 2018-01-29 DIAGNOSIS — G8929 Other chronic pain: Secondary | ICD-10-CM

## 2018-01-29 LAB — BASIC METABOLIC PANEL
BUN: 27 mg/dL — ABNORMAL HIGH (ref 6–23)
CO2: 40 mEq/L — ABNORMAL HIGH (ref 19–32)
Calcium: 9.6 mg/dL (ref 8.4–10.5)
Chloride: 93 mEq/L — ABNORMAL LOW (ref 96–112)
Creatinine, Ser: 1.57 mg/dL — ABNORMAL HIGH (ref 0.40–1.20)
GFR: 34.97 mL/min — AB (ref >60.00)
Glucose, Bld: 111 mg/dL — ABNORMAL HIGH (ref 70–99)
POTASSIUM: 3.5 meq/L (ref 3.5–5.1)
SODIUM: 139 meq/L (ref 135–145)

## 2018-01-29 LAB — VITAMIN B12: VITAMIN B 12: 203 pg/mL — AB (ref 211–911)

## 2018-01-29 LAB — MAGNESIUM: MAGNESIUM: 2.4 mg/dL (ref 1.5–2.5)

## 2018-01-29 LAB — TSH: TSH: 2.53 u[IU]/mL (ref 0.35–4.50)

## 2018-01-29 LAB — CK: CK TOTAL: 24 U/L (ref 7–177)

## 2018-01-29 MED ORDER — PREGABALIN ER 165 MG PO TB24: 165.0000 mg | ORAL_TABLET | Freq: Every day | ORAL | 0 refills | Status: AC

## 2018-01-29 NOTE — Patient Instructions (Addendum)
1.  We will check CK, BMP, Magnesium, B12 2.  We will check NCV-EMG of lower extremities 3.  Follow up after testing.  Your provider has requested that you have labwork completed today. Please go to [facility] ([id number]) on the second floor of this building before leaving the office today. You do not need to check in. If you are not called within 15 minutes please check with the front desk.

## 2018-01-31 ENCOUNTER — Telehealth: Payer: Self-pay

## 2018-01-31 NOTE — Telephone Encounter (Signed)
-----   Message from Pieter Partridge, DO sent at 01/31/2018 12:02 PM EDT ----- B12 level is low.  Recommend starting supplementation.  She should check with her PCP about starting injections.  Labs show evidence of possible kidney problems.  Does she have a history of this?  If this is new, I recommend following up with her PCP.

## 2018-01-31 NOTE — Telephone Encounter (Signed)
Called and spoke with Pt, advised her of results. She will; contact her PCP about B12 as well as kidney issues. She stated that since she started Lyrica, she does not urinate often. I stressed the importance of talking with her PCP

## 2018-02-11 DIAGNOSIS — R7309 Other abnormal glucose: Secondary | ICD-10-CM | POA: Diagnosis not present

## 2018-02-11 DIAGNOSIS — I10 Essential (primary) hypertension: Secondary | ICD-10-CM | POA: Diagnosis not present

## 2018-02-12 ENCOUNTER — Ambulatory Visit
Admission: RE | Admit: 2018-02-12 | Discharge: 2018-02-12 | Disposition: A | Discharge status: Home / Self Care | Payer: Medicare HMO | Source: Ambulatory Visit | Attending: Neurosurgery | Admitting: Neurosurgery

## 2018-02-12 DIAGNOSIS — M5440 Lumbago with sciatica, unspecified side: Secondary | ICD-10-CM

## 2018-02-12 DIAGNOSIS — M5126 Other intervertebral disc displacement, lumbar region: Secondary | ICD-10-CM | POA: Diagnosis not present

## 2018-02-12 MED ORDER — METHYLPREDNISOLONE ACETATE 40 MG/ML INJ SUSP (RADIOLOG
120.0000 mg | Freq: Once | INTRAMUSCULAR | Status: AC
  Administered 2018-02-12: 120 mg via EPIDURAL

## 2018-02-12 MED ORDER — IOPAMIDOL (ISOVUE-M 200) INJECTION 41%
1.0000 mL | Freq: Once | INTRAMUSCULAR | Status: AC
  Administered 2018-02-12: 1 mL via EPIDURAL

## 2018-02-12 NOTE — Discharge Instructions (Signed)

## 2018-02-25 ENCOUNTER — Encounter: Payer: Medicare HMO | Admitting: Neurology

## 2018-02-28 ENCOUNTER — Ambulatory Visit: Payer: Medicare HMO | Admitting: Neurology

## 2018-03-04 DIAGNOSIS — R69 Illness, unspecified: Secondary | ICD-10-CM | POA: Diagnosis not present

## 2018-03-04 DIAGNOSIS — M5440 Lumbago with sciatica, unspecified side: Secondary | ICD-10-CM | POA: Diagnosis not present

## 2018-03-25 ENCOUNTER — Ambulatory Visit (INDEPENDENT_AMBULATORY_CARE_PROVIDER_SITE_OTHER): Payer: Medicare HMO | Admitting: Neurology

## 2018-03-25 DIAGNOSIS — M5442 Lumbago with sciatica, left side: Secondary | ICD-10-CM

## 2018-03-25 DIAGNOSIS — G8929 Other chronic pain: Secondary | ICD-10-CM

## 2018-03-25 DIAGNOSIS — M5441 Lumbago with sciatica, right side: Secondary | ICD-10-CM | POA: Diagnosis not present

## 2018-03-25 NOTE — Procedures (Signed)
[facility]  [place], [place]  [place], [place] [address] Tel: [phone] Fax:  [phone] Test Date:  03/25/2018  Patient: Nicole Mcintosh DOB: 02/28/1951 Physician: Narda Amber, DO  Sex: Female Height: 5\' 7"  Ref Phys: Metta Clines, DO  ID#: [id number] Temp: 34.0C Technician:    Patient Complaints: This is a 67 year old female referred for evaluation of muscle cramps and low back pain.  NCV & EMG Findings: Extensive electrodiagnostic testing of the right lower extremity and additional studies of the left shows:  1. Bilateral sural and superficial peroneal sensory responses are within normal limits. 2. Bilateral peroneal and tibial motor responses are within normal limits. 3. Bilateral tibial H reflex studies are within normal limits. 4. There is no evidence of active or chronic motor axonal loss changes affecting any of the tested muscles.  Motor unit configuration and recruitment pattern is within normal limits.  Impression: This is a normal study of the lower extremities.  In particular, there is no evidence of a sensorimotor polyneuropathy, diffuse myopathy, or lumbosacral radiculopathy.   ___________________________ Narda Amber, DO    Nerve Conduction Studies Anti Sensory Summary Table   Site NR Peak (ms) Norm Peak (ms) P-T Amp (V) Norm P-T Amp  Left Sup Peroneal Anti Sensory (Ant Lat Mall)  34C  12 cm    2.0 <4.6 18.0 >3  Right Sup Peroneal Anti Sensory (Ant Lat Mall)  34C  12 cm    1.8 <4.6 22.3 >3  Left Sural Anti Sensory (Lat Mall)  34C  Calf    3.4 <4.6 28.4 >3  Right Sural Anti Sensory (Lat Mall)  34C  Calf    2.9 <4.6 23.0 >3   Motor Summary Table   Site NR Onset (ms) Norm Onset (ms) O-P Amp (mV) Norm O-P Amp Site1 Site2 Delta-0 (ms) Dist (cm) Vel (m/s) Norm Vel (m/s)  Left Peroneal Motor (Ext Dig Brev)  34C  Ankle    4.5 <6.0 2.9 >2.5 B Fib Ankle 8.3 38.0 46 >40  B Fib    12.8  2.1  Poplt B Fib 1.3 9.0 69 >40  Poplt     14.1  2.1         Right Peroneal Motor (Ext Dig Brev)  34C  Ankle    3.4 <6.0 3.3 >2.5 B Fib Ankle 8.2 38.0 46 >40  B Fib    11.6  3.3  Poplt B Fib 1.7 9.0 53 >40  Poplt    13.3  3.1         Left Tibial Motor (Abd Hall Brev)  34C  Ankle    5.0 <6.0 8.3 >4 Knee Ankle 7.7 40.0 52 >40  Knee    12.7  6.2         Right Tibial Motor (Abd Hall Brev)  34C  Ankle    4.1 <6.0 9.0 >4 Knee Ankle 8.2 40.0 49 >40  Knee    12.3  5.6          H Reflex Studies   NR H-Lat (ms) Lat Norm (ms) L-R H-Lat (ms)  Left Tibial (Gastroc)  34C     30.34 <35 0.00  Right Tibial (Gastroc)  34C     30.34 <35 0.00   EMG   Side Muscle Ins Act Fibs Psw Fasc Number Recrt Dur Dur. Amp Amp. Poly Poly. Comment  Right AntTibialis Nml Nml Nml Nml Nml Nml Nml Nml Nml Nml Nml Nml N/A  Right Gastroc Nml Nml  Nml Nml Nml Nml Nml Nml Nml Nml Nml Nml N/A  Right Flex Dig Long Nml Nml Nml Nml Nml Nml Nml Nml Nml Nml Nml Nml N/A  Right RectFemoris Nml Nml Nml Nml Nml Nml Nml Nml Nml Nml Nml Nml N/A  Right BicepsFemS Nml Nml Nml Nml Nml Nml Nml Nml Nml Nml Nml Nml N/A  Left BicepsFemS Nml Nml Nml Nml Nml Nml Nml Nml Nml Nml Nml Nml N/A  Left AntTibialis Nml Nml Nml Nml Nml Nml Nml Nml Nml Nml Nml Nml N/A  Left Gastroc Nml Nml Nml Nml Nml Nml Nml Nml Nml Nml Nml Nml N/A  Left Flex Dig Long Nml Nml Nml Nml Nml Nml Nml Nml Nml Nml Nml Nml N/A  Left RectFemoris Nml Nml Nml Nml Nml Nml Nml Nml Nml Nml Nml Nml N/A      Waveforms:

## 2018-03-26 ENCOUNTER — Ambulatory Visit: Payer: Medicare HMO | Admitting: Neurology

## 2018-03-26 ENCOUNTER — Telehealth: Payer: Self-pay

## 2018-03-26 ENCOUNTER — Telehealth: Payer: Self-pay | Admitting: Neurology

## 2018-03-26 ENCOUNTER — Encounter

## 2018-03-26 NOTE — Telephone Encounter (Signed)
Calleed and LMOVM advising Pt, advising to call if any questions

## 2018-03-26 NOTE — Telephone Encounter (Signed)
-----   Message from Pieter Partridge, DO sent at 03/26/2018  6:58 AM EST ----- Nerve and muscle study was normal.  Neurologic workup is complete.  I would continue pain management.

## 2018-03-26 NOTE — Telephone Encounter (Signed)
Patient was seen yesterday for an EMG. She was calling because she does not have a follow up appointment scheduled to see Dr. Tomi Likens. She would like to know what her results are when they available and also if she needs a follow up? She was given samples of Lyrica and she has 1 bottle left. Thanks

## 2018-03-28 NOTE — Telephone Encounter (Signed)
Called and advised Pt of normal EMG and of Dr Georgie Chard recommendation of continuing with pain management

## 2018-04-01 DIAGNOSIS — M545 Low back pain: Secondary | ICD-10-CM | POA: Diagnosis not present

## 2018-04-04 ENCOUNTER — Other Ambulatory Visit: Payer: Self-pay | Admitting: Neurosurgery

## 2018-04-04 DIAGNOSIS — M542 Cervicalgia: Secondary | ICD-10-CM | POA: Diagnosis not present

## 2018-04-04 DIAGNOSIS — M48062 Spinal stenosis, lumbar region with neurogenic claudication: Secondary | ICD-10-CM | POA: Diagnosis not present

## 2018-04-04 DIAGNOSIS — M5412 Radiculopathy, cervical region: Secondary | ICD-10-CM | POA: Diagnosis not present

## 2018-04-14 ENCOUNTER — Other Ambulatory Visit: Payer: Self-pay | Admitting: Neurosurgery

## 2018-04-14 ENCOUNTER — Other Ambulatory Visit: Payer: Medicare HMO

## 2018-04-14 DIAGNOSIS — M5412 Radiculopathy, cervical region: Secondary | ICD-10-CM

## 2018-04-17 ENCOUNTER — Other Ambulatory Visit: Payer: Medicare HMO

## 2018-04-20 ENCOUNTER — Ambulatory Visit
Admission: RE | Admit: 2018-04-20 | Discharge: 2018-04-20 | Disposition: A | Discharge status: Home / Self Care | Payer: Medicare HMO | Source: Ambulatory Visit | Attending: Neurosurgery | Admitting: Neurosurgery

## 2018-04-20 DIAGNOSIS — M4802 Spinal stenosis, cervical region: Secondary | ICD-10-CM | POA: Diagnosis not present

## 2018-04-20 DIAGNOSIS — M5412 Radiculopathy, cervical region: Secondary | ICD-10-CM

## 2018-04-21 DIAGNOSIS — H6690 Otitis media, unspecified, unspecified ear: Secondary | ICD-10-CM | POA: Diagnosis not present

## 2018-04-29 DIAGNOSIS — M542 Cervicalgia: Secondary | ICD-10-CM | POA: Diagnosis not present

## 2018-04-29 DIAGNOSIS — M48062 Spinal stenosis, lumbar region with neurogenic claudication: Secondary | ICD-10-CM | POA: Diagnosis not present

## 2018-05-13 ENCOUNTER — Ambulatory Visit
Admission: RE | Admit: 2018-05-13 | Discharge: 2018-05-13 | Disposition: A | Discharge status: Home / Self Care | Payer: Medicare HMO | Source: Ambulatory Visit | Attending: Neurosurgery | Admitting: Neurosurgery

## 2018-05-13 ENCOUNTER — Other Ambulatory Visit: Payer: Self-pay | Admitting: Neurosurgery

## 2018-05-13 DIAGNOSIS — M48062 Spinal stenosis, lumbar region with neurogenic claudication: Secondary | ICD-10-CM

## 2018-05-13 DIAGNOSIS — M545 Low back pain: Secondary | ICD-10-CM | POA: Diagnosis not present

## 2018-05-13 MED ORDER — METHYLPREDNISOLONE ACETATE 40 MG/ML INJ SUSP (RADIOLOG
120.0000 mg | Freq: Once | INTRAMUSCULAR | Status: AC
  Administered 2018-05-13: 120 mg via INTRA_ARTICULAR

## 2018-05-13 MED ORDER — IOPAMIDOL (ISOVUE-M 200) INJECTION 41%
1.0000 mL | Freq: Once | INTRAMUSCULAR | Status: AC
  Administered 2018-05-13: 1 mL via INTRA_ARTICULAR

## 2018-05-13 NOTE — Discharge Instructions (Signed)

## 2018-06-09 DIAGNOSIS — R293 Abnormal posture: Secondary | ICD-10-CM | POA: Diagnosis not present

## 2018-06-09 DIAGNOSIS — M5442 Lumbago with sciatica, left side: Secondary | ICD-10-CM | POA: Diagnosis not present

## 2018-06-09 DIAGNOSIS — M5441 Lumbago with sciatica, right side: Secondary | ICD-10-CM | POA: Diagnosis not present

## 2018-06-09 DIAGNOSIS — M542 Cervicalgia: Secondary | ICD-10-CM | POA: Diagnosis not present

## 2018-06-12 DIAGNOSIS — R7301 Impaired fasting glucose: Secondary | ICD-10-CM | POA: Diagnosis not present

## 2018-06-12 DIAGNOSIS — N3281 Overactive bladder: Secondary | ICD-10-CM | POA: Diagnosis not present

## 2018-06-12 DIAGNOSIS — N951 Menopausal and female climacteric states: Secondary | ICD-10-CM | POA: Diagnosis not present

## 2018-06-12 DIAGNOSIS — Z0000 Encounter for general adult medical examination without abnormal findings: Secondary | ICD-10-CM | POA: Diagnosis not present

## 2018-06-12 DIAGNOSIS — I10 Essential (primary) hypertension: Secondary | ICD-10-CM | POA: Diagnosis not present

## 2018-06-12 DIAGNOSIS — Z23 Encounter for immunization: Secondary | ICD-10-CM | POA: Diagnosis not present

## 2018-06-12 DIAGNOSIS — G9050 Complex regional pain syndrome I, unspecified: Secondary | ICD-10-CM | POA: Diagnosis not present

## 2018-06-12 DIAGNOSIS — E782 Mixed hyperlipidemia: Secondary | ICD-10-CM | POA: Diagnosis not present

## 2018-06-16 DIAGNOSIS — R293 Abnormal posture: Secondary | ICD-10-CM | POA: Diagnosis not present

## 2018-06-16 DIAGNOSIS — M5442 Lumbago with sciatica, left side: Secondary | ICD-10-CM | POA: Diagnosis not present

## 2018-06-16 DIAGNOSIS — M5441 Lumbago with sciatica, right side: Secondary | ICD-10-CM | POA: Diagnosis not present

## 2018-06-16 DIAGNOSIS — M542 Cervicalgia: Secondary | ICD-10-CM | POA: Diagnosis not present

## 2018-06-20 DIAGNOSIS — M542 Cervicalgia: Secondary | ICD-10-CM | POA: Diagnosis not present

## 2018-06-20 DIAGNOSIS — R293 Abnormal posture: Secondary | ICD-10-CM | POA: Diagnosis not present

## 2018-06-20 DIAGNOSIS — M5442 Lumbago with sciatica, left side: Secondary | ICD-10-CM | POA: Diagnosis not present

## 2018-06-20 DIAGNOSIS — M5441 Lumbago with sciatica, right side: Secondary | ICD-10-CM | POA: Diagnosis not present

## 2018-06-23 DIAGNOSIS — M5442 Lumbago with sciatica, left side: Secondary | ICD-10-CM | POA: Diagnosis not present

## 2018-06-23 DIAGNOSIS — R293 Abnormal posture: Secondary | ICD-10-CM | POA: Diagnosis not present

## 2018-06-23 DIAGNOSIS — M542 Cervicalgia: Secondary | ICD-10-CM | POA: Diagnosis not present

## 2018-06-23 DIAGNOSIS — M5441 Lumbago with sciatica, right side: Secondary | ICD-10-CM | POA: Diagnosis not present

## 2018-06-26 DIAGNOSIS — M5442 Lumbago with sciatica, left side: Secondary | ICD-10-CM | POA: Diagnosis not present

## 2018-06-26 DIAGNOSIS — M5441 Lumbago with sciatica, right side: Secondary | ICD-10-CM | POA: Diagnosis not present

## 2018-06-26 DIAGNOSIS — R293 Abnormal posture: Secondary | ICD-10-CM | POA: Diagnosis not present

## 2018-06-26 DIAGNOSIS — M542 Cervicalgia: Secondary | ICD-10-CM | POA: Diagnosis not present

## 2018-06-30 DIAGNOSIS — M5442 Lumbago with sciatica, left side: Secondary | ICD-10-CM | POA: Diagnosis not present

## 2018-06-30 DIAGNOSIS — R293 Abnormal posture: Secondary | ICD-10-CM | POA: Diagnosis not present

## 2018-06-30 DIAGNOSIS — M5441 Lumbago with sciatica, right side: Secondary | ICD-10-CM | POA: Diagnosis not present

## 2018-06-30 DIAGNOSIS — M542 Cervicalgia: Secondary | ICD-10-CM | POA: Diagnosis not present

## 2018-07-03 DIAGNOSIS — R293 Abnormal posture: Secondary | ICD-10-CM | POA: Diagnosis not present

## 2018-07-03 DIAGNOSIS — M5442 Lumbago with sciatica, left side: Secondary | ICD-10-CM | POA: Diagnosis not present

## 2018-07-03 DIAGNOSIS — M542 Cervicalgia: Secondary | ICD-10-CM | POA: Diagnosis not present

## 2018-07-03 DIAGNOSIS — M5441 Lumbago with sciatica, right side: Secondary | ICD-10-CM | POA: Diagnosis not present

## 2018-07-08 DIAGNOSIS — M542 Cervicalgia: Secondary | ICD-10-CM | POA: Diagnosis not present

## 2018-07-08 DIAGNOSIS — M5416 Radiculopathy, lumbar region: Secondary | ICD-10-CM | POA: Diagnosis not present

## 2018-07-25 DIAGNOSIS — M542 Cervicalgia: Secondary | ICD-10-CM | POA: Diagnosis not present

## 2018-07-25 DIAGNOSIS — M5442 Lumbago with sciatica, left side: Secondary | ICD-10-CM | POA: Diagnosis not present

## 2018-07-25 DIAGNOSIS — M5441 Lumbago with sciatica, right side: Secondary | ICD-10-CM | POA: Diagnosis not present

## 2018-07-25 DIAGNOSIS — R293 Abnormal posture: Secondary | ICD-10-CM | POA: Diagnosis not present

## 2018-08-03 DIAGNOSIS — R10817 Generalized abdominal tenderness: Secondary | ICD-10-CM | POA: Diagnosis not present

## 2018-08-03 DIAGNOSIS — E785 Hyperlipidemia, unspecified: Secondary | ICD-10-CM | POA: Diagnosis not present

## 2018-08-03 DIAGNOSIS — R935 Abnormal findings on diagnostic imaging of other abdominal regions, including retroperitoneum: Secondary | ICD-10-CM | POA: Diagnosis not present

## 2018-08-03 DIAGNOSIS — R51 Headache: Secondary | ICD-10-CM | POA: Diagnosis not present

## 2018-08-03 DIAGNOSIS — A084 Viral intestinal infection, unspecified: Secondary | ICD-10-CM | POA: Diagnosis not present

## 2018-08-03 DIAGNOSIS — A0839 Other viral enteritis: Secondary | ICD-10-CM | POA: Diagnosis not present

## 2018-08-03 DIAGNOSIS — I10 Essential (primary) hypertension: Secondary | ICD-10-CM | POA: Diagnosis not present

## 2018-08-03 DIAGNOSIS — R1031 Right lower quadrant pain: Secondary | ICD-10-CM | POA: Diagnosis not present

## 2018-08-03 DIAGNOSIS — R109 Unspecified abdominal pain: Secondary | ICD-10-CM | POA: Diagnosis not present

## 2018-08-03 DIAGNOSIS — E876 Hypokalemia: Secondary | ICD-10-CM | POA: Diagnosis not present

## 2018-08-03 DIAGNOSIS — R933 Abnormal findings on diagnostic imaging of other parts of digestive tract: Secondary | ICD-10-CM | POA: Diagnosis not present

## 2018-08-03 DIAGNOSIS — R112 Nausea with vomiting, unspecified: Secondary | ICD-10-CM | POA: Diagnosis not present

## 2018-08-03 DIAGNOSIS — R05 Cough: Secondary | ICD-10-CM | POA: Diagnosis not present

## 2018-08-03 DIAGNOSIS — E7800 Pure hypercholesterolemia, unspecified: Secondary | ICD-10-CM | POA: Diagnosis not present

## 2018-08-03 DIAGNOSIS — G43909 Migraine, unspecified, not intractable, without status migrainosus: Secondary | ICD-10-CM | POA: Diagnosis not present

## 2018-08-03 DIAGNOSIS — D649 Anemia, unspecified: Secondary | ICD-10-CM | POA: Diagnosis not present

## 2018-08-03 DIAGNOSIS — K529 Noninfective gastroenteritis and colitis, unspecified: Secondary | ICD-10-CM | POA: Diagnosis not present

## 2018-08-03 DIAGNOSIS — R918 Other nonspecific abnormal finding of lung field: Secondary | ICD-10-CM | POA: Diagnosis not present

## 2018-08-03 DIAGNOSIS — K7689 Other specified diseases of liver: Secondary | ICD-10-CM | POA: Diagnosis not present

## 2018-08-03 DIAGNOSIS — R110 Nausea: Secondary | ICD-10-CM | POA: Diagnosis not present

## 2018-08-03 DIAGNOSIS — D72829 Elevated white blood cell count, unspecified: Secondary | ICD-10-CM | POA: Diagnosis not present

## 2018-08-11 DIAGNOSIS — K529 Noninfective gastroenteritis and colitis, unspecified: Secondary | ICD-10-CM | POA: Diagnosis not present

## 2018-08-11 DIAGNOSIS — R911 Solitary pulmonary nodule: Secondary | ICD-10-CM | POA: Diagnosis not present

## 2018-08-11 DIAGNOSIS — M5430 Sciatica, unspecified side: Secondary | ICD-10-CM | POA: Diagnosis not present

## 2018-08-11 DIAGNOSIS — E876 Hypokalemia: Secondary | ICD-10-CM | POA: Diagnosis not present

## 2018-08-11 DIAGNOSIS — K639 Disease of intestine, unspecified: Secondary | ICD-10-CM | POA: Diagnosis not present

## 2018-08-12 ENCOUNTER — Other Ambulatory Visit: Payer: Self-pay | Admitting: Family Medicine

## 2018-08-12 DIAGNOSIS — R911 Solitary pulmonary nodule: Secondary | ICD-10-CM

## 2018-08-12 DIAGNOSIS — E876 Hypokalemia: Secondary | ICD-10-CM | POA: Diagnosis not present

## 2018-08-12 DIAGNOSIS — K529 Noninfective gastroenteritis and colitis, unspecified: Secondary | ICD-10-CM | POA: Diagnosis not present

## 2018-08-14 ENCOUNTER — Other Ambulatory Visit: Payer: Self-pay

## 2018-08-14 ENCOUNTER — Ambulatory Visit
Admission: RE | Admit: 2018-08-14 | Discharge: 2018-08-14 | Disposition: A | Discharge status: Home / Self Care | Payer: Medicare HMO | Source: Ambulatory Visit | Attending: Family Medicine | Admitting: Family Medicine

## 2018-08-14 DIAGNOSIS — R935 Abnormal findings on diagnostic imaging of other abdominal regions, including retroperitoneum: Secondary | ICD-10-CM | POA: Diagnosis not present

## 2018-08-14 DIAGNOSIS — R911 Solitary pulmonary nodule: Secondary | ICD-10-CM | POA: Diagnosis not present

## 2018-08-14 MED ORDER — IOPAMIDOL (ISOVUE-300) INJECTION 61%
75.0000 mL | Freq: Once | INTRAVENOUS | Status: AC | PRN
  Administered 2018-08-14: 75 mL via INTRAVENOUS

## 2018-08-18 ENCOUNTER — Other Ambulatory Visit: Payer: Self-pay | Admitting: Family Medicine

## 2018-08-18 ENCOUNTER — Other Ambulatory Visit: Payer: Self-pay | Admitting: Gastroenterology

## 2018-08-18 DIAGNOSIS — R9389 Abnormal findings on diagnostic imaging of other specified body structures: Secondary | ICD-10-CM

## 2018-08-18 DIAGNOSIS — E041 Nontoxic single thyroid nodule: Secondary | ICD-10-CM

## 2018-08-20 ENCOUNTER — Ambulatory Visit
Admission: RE | Admit: 2018-08-20 | Discharge: 2018-08-20 | Disposition: A | Discharge status: Home / Self Care | Payer: Medicare HMO | Source: Ambulatory Visit | Attending: Gastroenterology | Admitting: Gastroenterology

## 2018-08-20 ENCOUNTER — Other Ambulatory Visit: Payer: Self-pay

## 2018-08-20 ENCOUNTER — Ambulatory Visit
Admission: RE | Admit: 2018-08-20 | Discharge: 2018-08-20 | Disposition: A | Discharge status: Home / Self Care | Payer: Medicare HMO | Source: Ambulatory Visit | Attending: Family Medicine | Admitting: Family Medicine

## 2018-08-20 DIAGNOSIS — R9389 Abnormal findings on diagnostic imaging of other specified body structures: Secondary | ICD-10-CM

## 2018-08-20 DIAGNOSIS — E041 Nontoxic single thyroid nodule: Secondary | ICD-10-CM | POA: Diagnosis not present

## 2018-08-20 MED ORDER — IOPAMIDOL (ISOVUE-300) INJECTION 61%
80.0000 mL | Freq: Once | INTRAVENOUS | Status: AC | PRN
  Administered 2018-08-20: 80 mL via INTRAVENOUS

## 2018-08-21 ENCOUNTER — Other Ambulatory Visit: Payer: Medicare HMO

## 2018-08-21 DIAGNOSIS — M542 Cervicalgia: Secondary | ICD-10-CM | POA: Diagnosis not present

## 2018-08-21 DIAGNOSIS — M5440 Lumbago with sciatica, unspecified side: Secondary | ICD-10-CM | POA: Diagnosis not present

## 2018-08-28 ENCOUNTER — Other Ambulatory Visit: Payer: Self-pay | Admitting: Family Medicine

## 2018-08-28 DIAGNOSIS — Z1231 Encounter for screening mammogram for malignant neoplasm of breast: Secondary | ICD-10-CM

## 2018-08-28 DIAGNOSIS — N179 Acute kidney failure, unspecified: Secondary | ICD-10-CM | POA: Diagnosis not present

## 2018-08-29 ENCOUNTER — Other Ambulatory Visit: Payer: Self-pay | Admitting: Family Medicine

## 2018-08-29 DIAGNOSIS — E041 Nontoxic single thyroid nodule: Secondary | ICD-10-CM

## 2018-09-05 DIAGNOSIS — R293 Abnormal posture: Secondary | ICD-10-CM | POA: Diagnosis not present

## 2018-09-05 DIAGNOSIS — M545 Low back pain: Secondary | ICD-10-CM | POA: Diagnosis not present

## 2018-09-05 DIAGNOSIS — M542 Cervicalgia: Secondary | ICD-10-CM | POA: Diagnosis not present

## 2018-09-08 DIAGNOSIS — N179 Acute kidney failure, unspecified: Secondary | ICD-10-CM | POA: Diagnosis not present

## 2018-09-08 DIAGNOSIS — M545 Low back pain: Secondary | ICD-10-CM | POA: Diagnosis not present

## 2018-09-08 DIAGNOSIS — E041 Nontoxic single thyroid nodule: Secondary | ICD-10-CM | POA: Diagnosis not present

## 2018-09-08 DIAGNOSIS — M542 Cervicalgia: Secondary | ICD-10-CM | POA: Diagnosis not present

## 2018-09-08 DIAGNOSIS — F322 Major depressive disorder, single episode, severe without psychotic features: Secondary | ICD-10-CM | POA: Diagnosis not present

## 2018-09-08 DIAGNOSIS — R293 Abnormal posture: Secondary | ICD-10-CM | POA: Diagnosis not present

## 2018-09-08 DIAGNOSIS — I10 Essential (primary) hypertension: Secondary | ICD-10-CM | POA: Diagnosis not present

## 2018-09-15 DIAGNOSIS — R293 Abnormal posture: Secondary | ICD-10-CM | POA: Diagnosis not present

## 2018-09-15 DIAGNOSIS — M545 Low back pain: Secondary | ICD-10-CM | POA: Diagnosis not present

## 2018-09-15 DIAGNOSIS — M542 Cervicalgia: Secondary | ICD-10-CM | POA: Diagnosis not present

## 2018-09-17 DIAGNOSIS — M545 Low back pain: Secondary | ICD-10-CM | POA: Diagnosis not present

## 2018-09-17 DIAGNOSIS — R293 Abnormal posture: Secondary | ICD-10-CM | POA: Diagnosis not present

## 2018-09-17 DIAGNOSIS — M542 Cervicalgia: Secondary | ICD-10-CM | POA: Diagnosis not present

## 2018-09-17 DIAGNOSIS — N179 Acute kidney failure, unspecified: Secondary | ICD-10-CM | POA: Diagnosis not present

## 2018-09-29 DIAGNOSIS — M542 Cervicalgia: Secondary | ICD-10-CM | POA: Diagnosis not present

## 2018-09-29 DIAGNOSIS — R293 Abnormal posture: Secondary | ICD-10-CM | POA: Diagnosis not present

## 2018-09-29 DIAGNOSIS — M545 Low back pain: Secondary | ICD-10-CM | POA: Diagnosis not present

## 2018-10-08 ENCOUNTER — Other Ambulatory Visit (HOSPITAL_COMMUNITY)
Admission: RE | Admit: 2018-10-08 | Discharge: 2018-10-08 | Disposition: A | Discharge status: Home / Self Care | Payer: Medicare HMO | Source: Ambulatory Visit | Attending: Physician Assistant | Admitting: Physician Assistant

## 2018-10-08 ENCOUNTER — Ambulatory Visit
Admission: RE | Admit: 2018-10-08 | Discharge: 2018-10-08 | Disposition: A | Discharge status: Home / Self Care | Payer: Medicare HMO | Source: Ambulatory Visit | Attending: Family Medicine | Admitting: Family Medicine

## 2018-10-08 DIAGNOSIS — E041 Nontoxic single thyroid nodule: Secondary | ICD-10-CM

## 2018-10-08 NOTE — Procedures (Signed)
PROCEDURE SUMMARY:  Using direct ultrasound guidance, 4 passes were made using 25 g needles into the nodule within the left lobe of the thyroid.   Ultrasound was used to confirm needle placements on all occasions.   EBL = trace  Specimens were sent to Pathology for analysis.  See procedure note under Imaging tab in Epic for full procedure details.  Bilal Manzer S Myrla Malanowski PA-C 10/08/2018 11:41 AM

## 2018-10-20 DIAGNOSIS — N179 Acute kidney failure, unspecified: Secondary | ICD-10-CM | POA: Diagnosis not present

## 2018-11-03 ENCOUNTER — Ambulatory Visit
Admission: RE | Admit: 2018-11-03 | Discharge: 2018-11-03 | Disposition: A | Discharge status: Home / Self Care | Payer: Medicare HMO | Source: Ambulatory Visit | Attending: Family Medicine | Admitting: Family Medicine

## 2018-11-03 ENCOUNTER — Other Ambulatory Visit: Payer: Self-pay

## 2018-11-03 DIAGNOSIS — Z1231 Encounter for screening mammogram for malignant neoplasm of breast: Secondary | ICD-10-CM

## 2018-12-11 DIAGNOSIS — M542 Cervicalgia: Secondary | ICD-10-CM | POA: Diagnosis not present

## 2018-12-11 DIAGNOSIS — M5416 Radiculopathy, lumbar region: Secondary | ICD-10-CM | POA: Diagnosis not present

## 2018-12-15 DIAGNOSIS — E782 Mixed hyperlipidemia: Secondary | ICD-10-CM | POA: Diagnosis not present

## 2018-12-15 DIAGNOSIS — E041 Nontoxic single thyroid nodule: Secondary | ICD-10-CM | POA: Diagnosis not present

## 2018-12-15 DIAGNOSIS — R7301 Impaired fasting glucose: Secondary | ICD-10-CM | POA: Diagnosis not present

## 2018-12-15 DIAGNOSIS — I10 Essential (primary) hypertension: Secondary | ICD-10-CM | POA: Diagnosis not present

## 2018-12-18 DIAGNOSIS — E041 Nontoxic single thyroid nodule: Secondary | ICD-10-CM | POA: Diagnosis not present

## 2018-12-18 DIAGNOSIS — E782 Mixed hyperlipidemia: Secondary | ICD-10-CM | POA: Diagnosis not present

## 2018-12-18 DIAGNOSIS — R7301 Impaired fasting glucose: Secondary | ICD-10-CM | POA: Diagnosis not present

## 2018-12-18 DIAGNOSIS — M5430 Sciatica, unspecified side: Secondary | ICD-10-CM | POA: Diagnosis not present

## 2018-12-18 DIAGNOSIS — R911 Solitary pulmonary nodule: Secondary | ICD-10-CM | POA: Diagnosis not present

## 2018-12-18 DIAGNOSIS — G9050 Complex regional pain syndrome I, unspecified: Secondary | ICD-10-CM | POA: Diagnosis not present

## 2018-12-18 DIAGNOSIS — F322 Major depressive disorder, single episode, severe without psychotic features: Secondary | ICD-10-CM | POA: Diagnosis not present

## 2018-12-18 DIAGNOSIS — Z6839 Body mass index (BMI) 39.0-39.9, adult: Secondary | ICD-10-CM | POA: Diagnosis not present

## 2018-12-18 DIAGNOSIS — I10 Essential (primary) hypertension: Secondary | ICD-10-CM | POA: Diagnosis not present

## 2019-01-05 DIAGNOSIS — R1013 Epigastric pain: Secondary | ICD-10-CM | POA: Diagnosis not present

## 2019-01-05 DIAGNOSIS — K529 Noninfective gastroenteritis and colitis, unspecified: Secondary | ICD-10-CM | POA: Diagnosis not present

## 2019-01-05 DIAGNOSIS — R109 Unspecified abdominal pain: Secondary | ICD-10-CM | POA: Diagnosis not present

## 2019-01-05 DIAGNOSIS — R112 Nausea with vomiting, unspecified: Secondary | ICD-10-CM | POA: Diagnosis not present

## 2019-01-05 DIAGNOSIS — R197 Diarrhea, unspecified: Secondary | ICD-10-CM | POA: Diagnosis not present

## 2019-01-05 DIAGNOSIS — R1084 Generalized abdominal pain: Secondary | ICD-10-CM | POA: Diagnosis not present

## 2019-01-05 DIAGNOSIS — R1111 Vomiting without nausea: Secondary | ICD-10-CM | POA: Diagnosis not present

## 2019-01-05 DIAGNOSIS — R52 Pain, unspecified: Secondary | ICD-10-CM | POA: Diagnosis not present

## 2019-01-05 DIAGNOSIS — R110 Nausea: Secondary | ICD-10-CM | POA: Diagnosis not present

## 2019-01-12 ENCOUNTER — Other Ambulatory Visit: Payer: Self-pay | Admitting: *Deleted

## 2019-01-12 DIAGNOSIS — R6889 Other general symptoms and signs: Secondary | ICD-10-CM | POA: Diagnosis not present

## 2019-01-12 DIAGNOSIS — Z20822 Contact with and (suspected) exposure to covid-19: Secondary | ICD-10-CM

## 2019-01-13 LAB — NOVEL CORONAVIRUS, NAA: SARS-CoV-2, NAA: NOT DETECTED

## 2019-02-11 ENCOUNTER — Other Ambulatory Visit: Payer: Self-pay | Admitting: Family Medicine

## 2019-02-11 DIAGNOSIS — R911 Solitary pulmonary nodule: Secondary | ICD-10-CM

## 2019-02-12 ENCOUNTER — Other Ambulatory Visit: Payer: Self-pay | Admitting: Family Medicine

## 2019-02-12 DIAGNOSIS — R911 Solitary pulmonary nodule: Secondary | ICD-10-CM

## 2019-02-17 ENCOUNTER — Ambulatory Visit
Admission: RE | Admit: 2019-02-17 | Discharge: 2019-02-17 | Disposition: A | Discharge status: Home / Self Care | Payer: Medicare HMO | Source: Ambulatory Visit | Attending: Family Medicine | Admitting: Family Medicine

## 2019-02-17 DIAGNOSIS — R911 Solitary pulmonary nodule: Secondary | ICD-10-CM

## 2019-02-17 DIAGNOSIS — R918 Other nonspecific abnormal finding of lung field: Secondary | ICD-10-CM | POA: Diagnosis not present

## 2019-03-03 DIAGNOSIS — R197 Diarrhea, unspecified: Secondary | ICD-10-CM | POA: Diagnosis not present

## 2019-03-03 DIAGNOSIS — Z7189 Other specified counseling: Secondary | ICD-10-CM | POA: Diagnosis not present

## 2019-03-03 DIAGNOSIS — R634 Abnormal weight loss: Secondary | ICD-10-CM | POA: Diagnosis not present

## 2019-03-07 DIAGNOSIS — Z23 Encounter for immunization: Secondary | ICD-10-CM | POA: Diagnosis not present

## 2019-03-13 DIAGNOSIS — R634 Abnormal weight loss: Secondary | ICD-10-CM | POA: Diagnosis not present

## 2019-03-13 DIAGNOSIS — R1110 Vomiting, unspecified: Secondary | ICD-10-CM | POA: Diagnosis not present

## 2019-03-13 DIAGNOSIS — R933 Abnormal findings on diagnostic imaging of other parts of digestive tract: Secondary | ICD-10-CM | POA: Diagnosis not present

## 2019-03-13 DIAGNOSIS — R197 Diarrhea, unspecified: Secondary | ICD-10-CM | POA: Diagnosis not present

## 2019-03-19 DIAGNOSIS — M48062 Spinal stenosis, lumbar region with neurogenic claudication: Secondary | ICD-10-CM | POA: Diagnosis not present

## 2019-03-19 DIAGNOSIS — M542 Cervicalgia: Secondary | ICD-10-CM | POA: Diagnosis not present

## 2019-03-19 DIAGNOSIS — G5623 Lesion of ulnar nerve, bilateral upper limbs: Secondary | ICD-10-CM | POA: Diagnosis not present

## 2019-03-26 DIAGNOSIS — R197 Diarrhea, unspecified: Secondary | ICD-10-CM | POA: Diagnosis not present

## 2019-04-13 DIAGNOSIS — M4802 Spinal stenosis, cervical region: Secondary | ICD-10-CM | POA: Diagnosis not present

## 2019-04-13 DIAGNOSIS — G5623 Lesion of ulnar nerve, bilateral upper limbs: Secondary | ICD-10-CM | POA: Diagnosis not present

## 2019-04-13 DIAGNOSIS — R200 Anesthesia of skin: Secondary | ICD-10-CM | POA: Diagnosis not present

## 2019-04-16 DIAGNOSIS — M5412 Radiculopathy, cervical region: Secondary | ICD-10-CM | POA: Diagnosis not present

## 2019-04-16 DIAGNOSIS — M5126 Other intervertebral disc displacement, lumbar region: Secondary | ICD-10-CM | POA: Diagnosis not present

## 2019-04-16 DIAGNOSIS — I83213 Varicose veins of right lower extremity with both ulcer of ankle and inflammation: Secondary | ICD-10-CM

## 2019-04-16 DIAGNOSIS — R200 Anesthesia of skin: Secondary | ICD-10-CM | POA: Diagnosis not present

## 2019-04-17 DIAGNOSIS — K219 Gastro-esophageal reflux disease without esophagitis: Secondary | ICD-10-CM | POA: Diagnosis not present

## 2019-04-17 DIAGNOSIS — R112 Nausea with vomiting, unspecified: Secondary | ICD-10-CM | POA: Diagnosis not present

## 2019-04-17 DIAGNOSIS — A0472 Enterocolitis due to Clostridium difficile, not specified as recurrent: Secondary | ICD-10-CM | POA: Diagnosis not present

## 2019-04-21 ENCOUNTER — Other Ambulatory Visit: Payer: Self-pay | Admitting: Neurosurgery

## 2019-04-21 DIAGNOSIS — M5126 Other intervertebral disc displacement, lumbar region: Secondary | ICD-10-CM

## 2019-04-23 DIAGNOSIS — R112 Nausea with vomiting, unspecified: Secondary | ICD-10-CM | POA: Diagnosis not present

## 2019-04-27 ENCOUNTER — Other Ambulatory Visit: Payer: Self-pay

## 2019-04-27 ENCOUNTER — Ambulatory Visit: Payer: Medicare HMO | Attending: Neurosurgery | Admitting: Physical Therapy

## 2019-04-27 DIAGNOSIS — R293 Abnormal posture: Secondary | ICD-10-CM | POA: Diagnosis not present

## 2019-04-27 DIAGNOSIS — M5412 Radiculopathy, cervical region: Secondary | ICD-10-CM | POA: Diagnosis not present

## 2019-04-27 DIAGNOSIS — G8929 Other chronic pain: Secondary | ICD-10-CM | POA: Diagnosis not present

## 2019-04-27 DIAGNOSIS — R252 Cramp and spasm: Secondary | ICD-10-CM | POA: Diagnosis not present

## 2019-04-27 DIAGNOSIS — M5442 Lumbago with sciatica, left side: Secondary | ICD-10-CM | POA: Insufficient documentation

## 2019-04-27 DIAGNOSIS — H524 Presbyopia: Secondary | ICD-10-CM | POA: Diagnosis not present

## 2019-04-27 DIAGNOSIS — M5441 Lumbago with sciatica, right side: Secondary | ICD-10-CM | POA: Diagnosis not present

## 2019-04-27 DIAGNOSIS — M6281 Muscle weakness (generalized): Secondary | ICD-10-CM | POA: Insufficient documentation

## 2019-04-27 DIAGNOSIS — M542 Cervicalgia: Secondary | ICD-10-CM | POA: Insufficient documentation

## 2019-04-27 NOTE — Therapy (Signed)
[facility] [place] [address]  [place], [place], [address] Phone: [phone]   Fax:  [phone]  Physical Therapy Evaluation  Patient Details  Name: Nicole Mcintosh MRN: [id number] Date of Birth: 17-Aug-1950 Referring Provider (PT): Kary Kos, MD   Encounter Date: 04/27/2019  PT End of Session - 04/27/19 1012    Visit Number  1    Number of Visits  16    Date for PT Re-Evaluation  06/22/19    Authorization Type  [employer] (prior auth required) & Medicaid    PT Start Time  1012    PT Stop Time  1120    PT Time Calculation (min)  68 min    Activity Tolerance  Patient tolerated treatment well;Patient limited by pain    Behavior During Therapy  [facility] for tasks assessed/performed       Past Medical History:  Diagnosis Date  . Esophageal reflux   . Hematuria   . Hyperlipidemia   . Hypertension   . Impaired fasting glucose   . RSD (reflex sympathetic dystrophy)   . Varicose veins of both lower extremities     Past Surgical History:  Procedure Laterality Date  . ENDOVENOUS ABLATION SAPHENOUS VEIN W/ LASER Right 03/11/2017   endovenous laser ablation right greater saphenous vein by Tinnie Gens MD   . ENDOVENOUS [place] W/ LASER Left 04/01/2017   endovenous laser ablation left greater saphenous vein by Tinnie Gens MD   . LEFT HEART CATHETERIZATION WITH CORONARY ANGIOGRAM N/A 12/16/2013   Procedure: LEFT HEART CATHETERIZATION WITH CORONARY ANGIOGRAM;  Surgeon: Sinclair Grooms, MD;  Location: [facility] CATH LAB;  Service: Cardiovascular;  Laterality: N/A;    There were no vitals filed for this visit.   Subjective Assessment - 04/27/19 1016    Subjective  Pt reports ongoing "spinal injury" that limits bending over and picking things up, lifting overhead and standing tolerance. Has had injections (most recent 6-8 months ago) which helped manage the pain but did not help with movement. Reports  intermittent pain between shoulder blades. Pt believes "spinal injury" resulted from fall with coccyx fracture as a teenager but has only more recently been giving her trouble.    Limitations  Sitting;Standing;Walking;House hold activities;Lifting    How long can you sit comfortably?  20 minutes    How long can you stand comfortably?  15 minutes    How long can you walk comfortably?  15-20 minutes    Diagnostic tests  Cervical MRI 04/20/18:  No acute osseous or cord signal abnormality. Cervical spondylosis with predominantly discogenic degenerative changes at C4-5 through C6-7. Mild C5-6 and C6-7 spinal canal stenosis. No high-grade spinal canal stenosis. Severe left C5-6 foraminal stenosis. Multilevel mild foraminal stenosis.   Lumbar MRI 11/26/17: Moderate left and mild right subarticular stenosis at L4-5 has progressed secondary to leftward disc protrusion and bilateral facet hypertrophy. Mild foraminal narrowing at L4-5 is worse on the right. Progressive advanced facet hypertrophy at L3-4 with mild right foraminal narrowing. Progressive facet degenerative changes bilaterally at L2-3 is worse on the right. There is encroachment on the central canal without focal stenosis. (new lumbar MRI scheduled for 05/13/19)    Patient Stated Goals  "be able to walk as long as I want, bend over, pick things up and reach overhead"    Currently in Pain?  Yes    Pain Score  8    7-8/10   Pain Location  Back  Pain Orientation  Lower;Right;Left   R>L   Pain Descriptors / Indicators  Dull;Constant    Pain Type  Chronic pain    Pain Radiating Towards  sciatica pain down sides of B legs to knees, with "terrible cramping" below the knees    Pain Onset  More than a month ago   January - March 2020   Pain Frequency  Constant    Aggravating Factors   weather, walking, household chores    Pain Relieving Factors  heat    Effect of Pain on Daily Activities  prevents her from completing household chores, difficulty  navigating stairs    Pain Score  6    Pain Location  Neck   & upper back/shoulder blades   Pain Orientation  Lower    Pain Descriptors / Indicators  Stabbing;Tightness    Pain Type  Chronic pain    Pain Radiating Towards  heaviness into B arms with numbness into B hands    Pain Onset  More than a month ago   January - March 2020   Pain Frequency  Constant    Aggravating Factors   unpredictable, sometimes without identified trigger    Pain Relieving Factors  heat    Effect of Pain on Daily Activities  prevents her from completing household chores, difficulty looking up or down or R/L, difficulty washing hair         [facility] PT Assessment - 04/27/19 1012      Assessment   Medical Diagnosis  Cervical & lumbar radicular radiculopathy    Referring Provider (PT)  Kary Kos, MD    Onset Date/Surgical Date  --   >1 yr   Hand Dominance  Right    Next MD Visit  early January 2021    Prior Therapy  yes, predominantly for lumbar/core      Precautions   Precautions  None      Balance Screen   Has the patient fallen in the past 6 months  No    Has the patient had a decrease in activity level because of a fear of falling?   No    Is the patient reluctant to leave their home because of a fear of falling?   No      Home Environment   Living Environment  Private residence    Type of [place] to enter    Entrance Stairs-Number of Steps  4    Entrance Stairs-Rails  Right    Home Layout  Two level;Bed/bath upstairs;1/2 bath on main level    Alternate Level Stairs-Rails  Right    [place] - single point      Prior Function   Level of Independence  Independent    Vocation  Retired   previously disabled due to back   Leisure  listening to music, walking (currently limited to driveway), reading (not as much recently d/t difficulty focusing secondary to pain)      Cognition   Overall Cognitive Status  Within Functional Limits for tasks assessed       Observation/Other Assessments   Focus on Therapeutic Outcomes (FOTO)   Neck - 38% (62% limitation); Predicted 50% (50% limitation)    Other Surveys   Neck Disability Index    Neck Disability Index   52      Posture/Postural Control   Posture/Postural Control  Postural limitations    Postural Limitations  Forward head;Rounded Shoulders;Decreased lumbar  lordosis      ROM / Strength   AROM / PROM / Strength  AROM;PROM;Strength      AROM   AROM Assessment Site  Cervical;Lumbar;Shoulder    Right/Left Shoulder  Right;Left    Right Shoulder Flexion  111 Degrees    Right Shoulder ABduction  124 Degrees    Right Shoulder Internal Rotation  --   FIR to T11   Right Shoulder External Rotation  --   FER to T2   Left Shoulder Flexion  124 Degrees    Left Shoulder ABduction  125 Degrees    Left Shoulder Internal Rotation  --   FIR to T9   Left Shoulder External Rotation  --   FER to T2   Cervical Flexion  45 dg - pulling    Cervical Extension  33 dg - popping    Cervical - Right Side Bend  25 dg - popping    Cervical - Left Side Bend  18 dg - tightness    Cervical - Right Rotation  58 dg - no pain    Cervical - Left Rotation  60 dg - pulling    Lumbar Flexion  hands to ankles - grabbing pain    Lumbar Extension  50% limited - pain    Lumbar - Right Side Bend  hands to lateral knee joint line - pain    Lumbar - Left Side Bend  hands to lateral knee joint line - pain    Lumbar - Right Rotation  25% limited - pulling    Lumbar - Left Rotation  25% limited - pulling      PROM   Overall PROM Comments  Cervical PROM WFL other than mildly limited L lateral flexion      Strength   Overall Strength Comments  tested in sitting    Strength Assessment Site  Shoulder;Hand;Hip;Knee    Right/Left Shoulder  Right;Left    Right Shoulder Flexion  4-/5    Right Shoulder ABduction  4-/5    Right Shoulder Internal Rotation  4+/5    Right Shoulder External Rotation  4/5    Left Shoulder Flexion  4-/5     Left Shoulder ABduction  4-/5    Left Shoulder Internal Rotation  4+/5    Left Shoulder External Rotation  4/5    Right/Left hand  Right;Left    Right Hand Grip (lbs)  45   45, 45, 45   Left Hand Grip (lbs)  34.67   38, 36, 30   Right/Left Hip  Right;Left    Right Hip Flexion  4-/5    Right Hip External Rotation   4-/5    Right Hip Internal Rotation  4-/5    Right Hip ABduction  4-/5    Right Hip ADduction  3+/5    Left Hip Flexion  4-/5    Left Hip External Rotation  4-/5    Left Hip Internal Rotation  3/5   cramping   Left Hip ABduction  3+/5    Left Hip ADduction  3/5    Right/Left Knee  Right;Left    Right Knee Flexion  4-/5    Right Knee Extension  3+/5    Left Knee Flexion  4-/5    Left Knee Extension  4-/5      Palpation   Palpation comment  ttp over B suboccipitals, cervical paraspinals, UT & LS as well as lumbar paraspinals & distal  LEs      Special Tests  Special Tests  Cervical    Cervical Tests  Dictraction;Spurling's      Spurling's   Findings  Positive    Side  Right   & Lt     Distraction Test   Findngs  Negative    Comment  pt noting decreased pain                Objective measurements completed on examination: See above findings.      [facility] Adult PT Treatment/Exercise - 04/27/19 1012      Exercises   Exercises  Neck      Neck Exercises: Seated   Neck Retraction  10 reps;5 secs    Other Seated Exercise  Scapular retraction + depression 10 x 5" (cues to avoid shoulder shrug)      Neck Exercises: Stretches   Upper Trapezius Stretch  Right;Left;30 seconds;2 reps    Levator Stretch  Right;Left;30 seconds;2 reps             PT Education - 04/27/19 1120    Education Details  PT eval findings, anticipate POC & initial HEP    Person(s) Educated  Patient    Methods  Explanation;Demonstration;Verbal cues;Handout    Comprehension  Verbalized understanding;Returned demonstration;Verbal cues required;Need further instruction        PT Short Term Goals - 04/27/19 1120      PT SHORT TERM GOAL #1   Title  Patient will be independent with initial HEP    Status  New    Target Date  05/25/19      PT SHORT TERM GOAL #2   Title  Patient will verbalize/demonstrate understanding of neutral spine posture and proper body mechanics to reduce strain on cervical and lumbar spine    Status  New    Target Date  05/25/19        PT Long Term Goals - 04/27/19 1204      PT LONG TERM GOAL #1   Title  Patient will be independent with ongoing/advanced HEP    Status  New    Target Date  06/22/19      PT LONG TERM GOAL #2   Title  Patient to demonstrate appropriate posture and body mechanics needed for daily activities    Status  New    Target Date  06/22/19      PT LONG TERM GOAL #3   Title  Patient to improve cervical AROM to WFL/WNL without pain provocation    Status  New    Target Date  06/22/19      PT LONG TERM GOAL #4   Title  Patient to report cervical pain and UE radiculopathy reduction in frequency and intensity by >/= 50%    Status  New    Target Date  06/22/19      PT LONG TERM GOAL #5   Title  Patient to report ability to perform ADLs and household tasks without increased pain    Status  New    Target Date  06/22/19             Plan - 04/27/19 1120    Clinical Impression Statement  M[place]ala is a 68 y/o female who presents to OP PT with referral for cervical radiculopathy at C6. Her complaints include B neck, upper back and low back pain with B UE and LE radiculopathy. Cervical MRI from 04/20/18 reveals disc cervical spondylosis with predominantly discogenic degenerative changes at C4-5 through C6-7 and severe left C5-6 foraminal stenosis along  with mild C5-6 and C6-7 spinal canal stenosis and multilevel mild foraminal stenosis. Cervical and B shoulder ROM limited with ttp and increased muscle tension evident in B cervical paraspinals, upper shoulder and periscapular musculature. Pain decreases with  cervical distraction and increases with axial loading. Mild to moderate B shoulder and L>R grip weakness present. Postural deficits evident with forward head and forward rounded shoulders - patient reports attempt at postural correction for upper back and shoulders causes increased LBP. [place] will benefit from skilled PT to normalize muscle tension in neck, upper back and shoulders to reduce C6 nerve irritation as well as postural training and strengthening to improve proximal stability and reduce pain along with further assessment and treatment as indicated of low back pain and lumbar radiculopathy.    Personal Factors and Comorbidities  Comorbidity 3+;Age;Past/Current Experience;Time since onset of injury/illness/exacerbation;Fitness    Comorbidities  HTN, RSD, angina, GERD    Examination-Activity Limitations  Bathing;Bend;Carry;Continence;Dressing;Hygiene/Grooming;Lift;Locomotion Level;Reach Overhead;Sit;Sleep;Squat;Stairs;Stand;Toileting;Transfers    Examination-Participation Restrictions  Cleaning;Community Activity;Laundry;Meal Prep;Shop    Stability/Clinical Decision Making  Evolving/Moderate complexity    Clinical Decision Making  Moderate    Rehab Potential  Good    PT Frequency  2x / week    PT Duration  8 weeks    PT Treatment/Interventions  ADLs/Self Care Home Management;Cryotherapy;Electrical Stimulation;Iontophoresis 4mg /ml Dexamethasone;Moist Heat;Traction;Ultrasound;DME Instruction;Gait training;Stair training;Functional mobility training;Therapeutic activities;Therapeutic exercise;Balance training;Neuromuscular re-education;Patient/family education;Manual techniques;Passive range of motion;Dry needling;Taping;Spinal Manipulations;Joint Manipulations    PT Next Visit Plan  Review initial HEP; posture and body mechanics education; spinal & proximal UE/LE flexibility; postural/core strengthening; manual therapy and modalities as indicated    PT Home Exercise Plan  04/27/19 - chin tuck  (supine or sitting per pt preference), UT & LS stretches, scap retraction    Consulted and Agree with Plan of Care  Patient       Patient will benefit from skilled therapeutic intervention in order to improve the following deficits and impairments:  Decreased activity tolerance, Decreased balance, Decreased endurance, Decreased knowledge of precautions, Decreased mobility, Decreased range of motion, Decreased safety awareness, Decreased strength, Difficulty walking, Increased fascial restricitons, Increased muscle spasms, Impaired perceived functional ability, Impaired flexibility, Impaired sensation, Impaired UE functional use, Improper body mechanics, Postural dysfunction, Pain  Visit Diagnosis: Radiculopathy, cervical region  Cervicalgia  Abnormal posture  Muscle weakness (generalized)  Cramp and spasm  Chronic bilateral low back pain with bilateral sciatica     Problem List Patient Active Problem List   Diagnosis Date Noted  . Varicose veins of bilateral lower extremities with other complications [id number]  . Abnormal EKG 11/27/2013  . Angina pectoris syndrome ([place]) 11/27/2013  . Hypertension   . Hyperlipidemia   . RSD (reflex sympathetic dystrophy)     Percival Spanish, PT, MPT 04/27/2019, 12:21 PM  [facility] [address]  [place], [place], [address] Phone: [phone]   Fax:  [phone]  Name: BLESSING ASKAR MRN: [id number] Date of Birth: 1950-05-23

## 2019-04-27 NOTE — Patient Instructions (Signed)
    Home exercise program created by Ilay Capshaw, PT.  For questions, please contact Harly Pipkins via phone at [phone] or email at [email or web address]  [facility] [place] [address]  [address] [place], [place], [address] Phone: [phone]   Fax:  [phone]    

## 2019-04-29 ENCOUNTER — Ambulatory Visit: Payer: Medicare HMO

## 2019-04-30 DIAGNOSIS — R112 Nausea with vomiting, unspecified: Secondary | ICD-10-CM | POA: Diagnosis not present

## 2019-05-05 ENCOUNTER — Other Ambulatory Visit: Payer: Self-pay

## 2019-05-05 ENCOUNTER — Ambulatory Visit: Payer: Medicare HMO

## 2019-05-05 DIAGNOSIS — R293 Abnormal posture: Secondary | ICD-10-CM

## 2019-05-05 DIAGNOSIS — G8929 Other chronic pain: Secondary | ICD-10-CM

## 2019-05-05 DIAGNOSIS — M5412 Radiculopathy, cervical region: Secondary | ICD-10-CM | POA: Diagnosis not present

## 2019-05-05 DIAGNOSIS — M6281 Muscle weakness (generalized): Secondary | ICD-10-CM

## 2019-05-05 DIAGNOSIS — M5442 Lumbago with sciatica, left side: Secondary | ICD-10-CM | POA: Diagnosis not present

## 2019-05-05 DIAGNOSIS — M542 Cervicalgia: Secondary | ICD-10-CM

## 2019-05-05 DIAGNOSIS — M5441 Lumbago with sciatica, right side: Secondary | ICD-10-CM | POA: Diagnosis not present

## 2019-05-05 DIAGNOSIS — R252 Cramp and spasm: Secondary | ICD-10-CM

## 2019-05-05 NOTE — Patient Instructions (Signed)

## 2019-05-05 NOTE — Therapy (Signed)
[facility] [address]  [place], [place], [address] Phone: [phone]   Fax:  [phone]  Physical Therapy Treatment  Patient Details  Name: Nicole Mcintosh MRN: [id number] Date of Birth: [birth year] Referring Provider (PT): Kary Kos, MD   Encounter Date: 05/05/2019  PT End of Session - 05/05/19 0937    Visit Number  2    Number of Visits  16    Date for PT Re-Evaluation  06/22/19    Authorization Type  [employer] Medicare (prior auth required) & Medicaid    Authorization Time Period  [employer] 04/29/19 - 10/28/18    PT Start Time  0932    PT Stop Time  1030   ended visit with 15 min moist heat   PT Time Calculation (min)  58 min    Activity Tolerance  Patient tolerated treatment well;Patient limited by pain    Behavior During Therapy  [facility] for tasks assessed/performed       Past Medical History:  Diagnosis Date  . Esophageal reflux   . Hematuria   . Hyperlipidemia   . Hypertension   . Impaired fasting glucose   . RSD (reflex sympathetic dystrophy)   . Varicose veins of both lower extremities     Past Surgical History:  Procedure Laterality Date  . ENDOVENOUS ABLATION SAPHENOUS VEIN W/ LASER Right 03/11/2017   endovenous laser ablation right greater saphenous vein by Tinnie Gens MD   . ENDOVENOUS [place] W/ LASER Left 04/01/2017   endovenous laser ablation left greater saphenous vein by Tinnie Gens MD   . LEFT HEART CATHETERIZATION WITH CORONARY ANGIOGRAM N/A 12/16/2013   Procedure: LEFT HEART CATHETERIZATION WITH CORONARY ANGIOGRAM;  Surgeon: Sinclair Grooms, MD;  Location: [facility] CATH LAB;  Service: Cardiovascular;  Laterality: N/A;    There were no vitals filed for this visit.  Subjective Assessment - 05/05/19 0934    Subjective  Pt. primary complaint is R scapular pain.    Diagnostic tests  Cervical MRI 04/20/18:  No acute osseous or cord signal abnormality. Cervical spondylosis with  predominantly discogenic degenerative changes at C4-5 through C6-7. Mild C5-6 and C6-7 spinal canal stenosis. No high-grade spinal canal stenosis. Severe left C5-6 foraminal stenosis. Multilevel mild foraminal stenosis.   Lumbar MRI 11/26/17: Moderate left and mild right subarticular stenosis at L4-5 has progressed secondary to leftward disc protrusion and bilateral facet hypertrophy. Mild foraminal narrowing at L4-5 is worse on the right. Progressive advanced facet hypertrophy at L3-4 with mild right foraminal narrowing. Progressive facet degenerative changes bilaterally at L2-3 is worse on the right. There is encroachment on the central canal without focal stenosis. (new lumbar MRI scheduled for 05/13/19)    Patient Stated Goals  "be able to walk as long as I want, bend over, pick things up and reach overhead"    Currently in Pain?  Yes    Pain Score  6     Pain Location  Scapula    Pain Orientation  Right    Pain Descriptors / Indicators  Burning    Pain Type  Chronic pain    Pain Radiating Towards  denies    Pain Onset  More than a month ago    Pain Frequency  Constant    Aggravating Factors   household chores    Pain Relieving Factors  heat    Pain Score  6    Pain Location  Back    Pain Orientation  Lower    Pain Descriptors / Indicators  Dull   "grabbing" at times   Pain Type  Chronic pain    Pain Onset  More than a month ago    Pain Frequency  Constant                       [facility] Adult PT Treatment/Exercise - 05/05/19 0001      Self-Care   Self-Care  Other Self-Care Comments;Posture    Posture  Instruction in proper sitting and standing posture as to reduce cervical strain     Other Self-Care Comments   Instruction in proper posture and body mechanics with daily activities such as vacuuming, cooking, and laundry as to reduce lumbar and cervical strain       Neck Exercises: Seated   Neck Retraction  10 reps;5 secs    Neck Retraction Limitations  good technique  without cueing     Other Seated Exercise  Scapular retraction + depression 10 x 5"    required cueing for increased scap. ROM      Shoulder Exercises: Pulleys   Flexion  3 minutes    Flexion Limitations  cues to stay within comfortable ROM     Scaption  3 minutes    Scaption Limitations  cues to scay within comfortable ROM       Modalities   Modalities  Moist Heat      Moist Heat Therapy   Number Minutes Moist Heat  15 Minutes    Moist Heat Location  Lumbar Spine   mid back and cervical      Neck Exercises: Stretches   Upper Trapezius Stretch  Right;Left;30 seconds;2 reps   minor cueing for positioning    Levator Stretch  Right;Left;30 seconds;2 reps             PT Education - 05/05/19 1244    Education Details  instruction in proper posture nad body mechancis with handout issued to pt.    Person(s) Educated  Patient    Methods  Explanation;Demonstration;Verbal cues;Handout    Comprehension  Verbalized understanding;Returned demonstration;Verbal cues required       PT Short Term Goals - 05/05/19 0939      PT SHORT TERM GOAL #1   Title  Patient will be independent with initial HEP    Status  On-going    Target Date  05/25/19      PT SHORT TERM GOAL #2   Title  Patient will verbalize/demonstrate understanding of neutral spine posture and proper body mechanics to reduce strain on cervical and lumbar spine    Status  On-going    Target Date  05/25/19        PT Long Term Goals - 05/05/19 1252      PT LONG TERM GOAL #1   Title  Patient will be independent with ongoing/advanced HEP    Status  On-going      PT LONG TERM GOAL #2   Title  Patient to demonstrate appropriate posture and body mechanics needed for daily activities    Status  On-going      PT LONG TERM GOAL #3   Title  Patient to improve cervical AROM to WFL/WNL without pain provocation    Status  On-going      PT LONG TERM GOAL #4   Title  Patient to report cervical pain and UE radiculopathy  reduction in frequency and intensity by >/= 50%    Status  On-going  PT LONG TERM GOAL #5   Title  Patient to report ability to perform ADLs and household tasks without increased pain    Status  On-going            Plan - 05/05/19 1008    Clinical Impression Statement  Pt. primary complaint today is "burning" scapular R scapular pain which bothers her at rest and with household chores.  Focused today's session on instruction in proper posture and body mechanics with daily and household tasks to reduce lumbar and cervical strain.  Focused on upright posture with vacuuming, kitchen work, Medical sales representative with pt. verbalizing she plans to alter her positioning and see if this improves her tolerated for these tasks.  Duration of session focused on HEP review and only minor cueing required for scap. retraction/depression with scapular retraction HEP activity.  Pt. with good overall demonstration of HEP.  Ended session with moist heat to upper back as pt. noting good benefit from this at home.  Will monitor response in coming sessions.    Comorbidities  HTN, RSD, angina, GERD    Rehab Potential  Good    PT Treatment/Interventions  ADLs/Self Care Home Management;Cryotherapy;Electrical Stimulation;Iontophoresis 4mg /ml Dexamethasone;Moist Heat;Traction;Ultrasound;DME Instruction;Gait training;Stair training;Functional mobility training;Therapeutic activities;Therapeutic exercise;Balance training;Neuromuscular re-education;Patient/family education;Manual techniques;Passive range of motion;Dry needling;Taping;Spinal Manipulations;Joint Manipulations    PT Next Visit Plan  spinal & proximal UE/LE flexibility; postural/core strengthening; manual therapy and modalities as indicated    PT Home Exercise Plan  04/27/19 - chin tuck (supine or sitting per pt preference), UT & LS stretches, scap retraction    Consulted and Agree with Plan of Care  Patient       Patient will benefit from skilled therapeutic  intervention in order to improve the following deficits and impairments:  Decreased activity tolerance, Decreased balance, Decreased endurance, Decreased knowledge of precautions, Decreased mobility, Decreased range of motion, Decreased safety awareness, Decreased strength, Difficulty walking, Increased fascial restricitons, Increased muscle spasms, Impaired perceived functional ability, Impaired flexibility, Impaired sensation, Impaired UE functional use, Improper body mechanics, Postural dysfunction, Pain  Visit Diagnosis: Radiculopathy, cervical region  Cervicalgia  Abnormal posture  Muscle weakness (generalized)  Cramp and spasm  Chronic bilateral low back pain with bilateral sciatica     Problem List Patient Active Problem List   Diagnosis Date Noted  . Varicose veins of bilateral lower extremities with other complications 99991111  . Abnormal EKG 11/27/2013  . Angina pectoris syndrome ([place]) 11/27/2013  . Hypertension   . Hyperlipidemia   . RSD (reflex sympathetic dystrophy)     Bess Harvest, PTA 05/05/19 12:55 PM   [facility] [address]  [place], [place], [address] Phone: [phone]   Fax:  [phone]  Name: Nicole Mcintosh MRN: [id number] Date of Birth: August 11, 1950

## 2019-05-06 ENCOUNTER — Ambulatory Visit: Payer: Medicare HMO

## 2019-05-06 DIAGNOSIS — M542 Cervicalgia: Secondary | ICD-10-CM

## 2019-05-06 DIAGNOSIS — M5442 Lumbago with sciatica, left side: Secondary | ICD-10-CM | POA: Diagnosis not present

## 2019-05-06 DIAGNOSIS — R293 Abnormal posture: Secondary | ICD-10-CM

## 2019-05-06 DIAGNOSIS — M5441 Lumbago with sciatica, right side: Secondary | ICD-10-CM | POA: Diagnosis not present

## 2019-05-06 DIAGNOSIS — M5412 Radiculopathy, cervical region: Secondary | ICD-10-CM | POA: Diagnosis not present

## 2019-05-06 DIAGNOSIS — G8929 Other chronic pain: Secondary | ICD-10-CM | POA: Diagnosis not present

## 2019-05-06 DIAGNOSIS — M6281 Muscle weakness (generalized): Secondary | ICD-10-CM | POA: Diagnosis not present

## 2019-05-06 DIAGNOSIS — R252 Cramp and spasm: Secondary | ICD-10-CM

## 2019-05-06 NOTE — Therapy (Signed)
[facility] [address]  [place], [place], [address] Phone: [phone]   Fax:  [phone]  Physical Therapy Treatment  Patient Details  Name: Nicole Mcintosh MRN: [id number] Date of Birth: Dec 25, 1950 Referring Provider (PT): Kary Kos, MD   Encounter Date: 05/06/2019  PT End of Session - 05/06/19 1034    Visit Number  3    Number of Visits  16    Date for PT Re-Evaluation  06/22/19    Authorization Type  [employer] Medicare (prior auth required) & Medicaid    Authorization Time Period  [employer] 04/29/19 - 10/28/18    PT Start Time  1010    PT Stop Time  1108   Ended visit with 10 min moist heat   PT Time Calculation (min)  58 min    Activity Tolerance  Patient tolerated treatment well;Patient limited by pain    Behavior During Therapy  [facility] for tasks assessed/performed       Past Medical History:  Diagnosis Date  . Esophageal reflux   . Hematuria   . Hyperlipidemia   . Hypertension   . Impaired fasting glucose   . RSD (reflex sympathetic dystrophy)   . Varicose veins of both lower extremities     Past Surgical History:  Procedure Laterality Date  . ENDOVENOUS ABLATION SAPHENOUS VEIN W/ LASER Right 03/11/2017   endovenous laser ablation right greater saphenous vein by Tinnie Gens MD   . ENDOVENOUS [place] W/ LASER Left 04/01/2017   endovenous laser ablation left greater saphenous vein by Tinnie Gens MD   . LEFT HEART CATHETERIZATION WITH CORONARY ANGIOGRAM N/A 12/16/2013   Procedure: LEFT HEART CATHETERIZATION WITH CORONARY ANGIOGRAM;  Surgeon: Sinclair Grooms, MD;  Location: [facility] CATH LAB;  Service: Cardiovascular;  Laterality: N/A;    There were no vitals filed for this visit.  Subjective Assessment - 05/06/19 1014    Subjective  Pt. primary concern today is B upper shoulder "stiffness".    Diagnostic tests  Cervical MRI 04/20/18:  No acute osseous or cord signal abnormality. Cervical  spondylosis with predominantly discogenic degenerative changes at C4-5 through C6-7. Mild C5-6 and C6-7 spinal canal stenosis. No high-grade spinal canal stenosis. Severe left C5-6 foraminal stenosis. Multilevel mild foraminal stenosis.   Lumbar MRI 11/26/17: Moderate left and mild right subarticular stenosis at L4-5 has progressed secondary to leftward disc protrusion and bilateral facet hypertrophy. Mild foraminal narrowing at L4-5 is worse on the right. Progressive advanced facet hypertrophy at L3-4 with mild right foraminal narrowing. Progressive facet degenerative changes bilaterally at L2-3 is worse on the right. There is encroachment on the central canal without focal stenosis. (new lumbar MRI scheduled for 05/13/19)    Patient Stated Goals  "be able to walk as long as I want, bend over, pick things up and reach overhead"    Currently in Pain?  Yes    Pain Score  3     Pain Location  Shoulder    Pain Orientation  Upper;Right;Left    Pain Descriptors / Indicators  --   " Stiffness "   Pain Type  Chronic pain    Pain Radiating Towards  denies today    Pain Onset  More than a month ago    Pain Frequency  Constant    Aggravating Factors   Unsure    Multiple Pain Sites  No  [facility] Adult PT Treatment/Exercise - 05/06/19 0001      Self-Care   Self-Care  Other Self-Care Comments    Posture  Reinforced importance of proper sitting and standing posture to reduce cervical and lumbar strain;  discused positioning for pt. while reading and sitting on couch with her legs elevated on stool;  pt. had previously (for 1 hour a day) been slumping forward looking down with book in her lap;  Pt. encouraged to place large pillow under elbows to proper elbows up and be able to hold book at head level without using upper shoulder musculature;  instructed to try this with reading and inform therapist if some relief from pain with this activity provided.       Neck Exercises:  Machines for Strengthening   UBE (Upper Arm Bike)  UBE: lvl 1.0, 3 min forwards/ 3 backwards     Nustep  Lvl 2, 7 min (UE/LE)      Neck Exercises: Seated   Neck Retraction  10 reps;5 secs    Neck Retraction Limitations  good technique without cueing     Other Seated Exercise  Scapular retraction + depression 10 x 5"       Moist Heat Therapy   Number Minutes Moist Heat  10 Minutes    Moist Heat Location  Lumbar Spine   + B upper shoulder/cervical spine in seated pos     Manual Therapy   Manual Therapy  Soft tissue mobilization;Myofascial release    Manual therapy comments  sitting     Soft tissue mobilization  STM to B UT, LS, cervical paraspinals - focusing on R UT    Myofascial Release  TPR to R UT      Neck Exercises: Stretches   Upper Trapezius Stretch  Right;Left;30 seconds;2 reps    Upper Trapezius Stretch Limitations  overpressure from therapist to anchor shoulder/scapula     Levator Stretch  Right;Left;30 seconds;2 reps    Levator Stretch Limitations  Overpressure from therapist to anchor shoulder/scapula    Neck Stretch  2 reps;20 seconds    Neck Stretch Limitations  B scalenes stretch with varying cerv rotation                PT Short Term Goals - 05/05/19 0939      PT SHORT TERM GOAL #1   Title  Patient will be independent with initial HEP    Status  On-going    Target Date  05/25/19      PT SHORT TERM GOAL #2   Title  Patient will verbalize/demonstrate understanding of neutral spine posture and proper body mechanics to reduce strain on cervical and lumbar spine    Status  On-going    Target Date  05/25/19        PT Long Term Goals - 05/05/19 1252      PT LONG TERM GOAL #1   Title  Patient will be independent with ongoing/advanced HEP    Status  On-going      PT LONG TERM GOAL #2   Title  Patient to demonstrate appropriate posture and body mechanics needed for daily activities    Status  On-going      PT LONG TERM GOAL #3   Title  Patient to  improve cervical AROM to WFL/WNL without pain provocation    Status  On-going      PT LONG TERM GOAL #4   Title  Patient to report cervical pain and UE radiculopathy reduction in frequency  and intensity by >/= 50%    Status  On-going      PT LONG TERM GOAL #5   Title  Patient to report ability to perform ADLs and household tasks without increased pain    Status  On-going            Plan - 05/06/19 1342    Clinical Impression Statement  Nicole Mcintosh reporting improvement in pain levels since last session and says she is more aware of her sitting posture while doing household activities and adjusting her body mechanics with vacuuming, kitchen work, Medical sales representative.  Session with further instruction in alternative positioning using pillow for UE elevation and support with reading (as pt. reads 1 hour a day sitting on couch with book in her lap while looking down).  Progressed postural strengthening activities with pt. demonstrating improved technique with less cueing required.  MT focused on STM/DTM to B upper shoulder musculature with TPR to TP noted in R UT - pt. noted relief.  Ended session with trial of moist heat in seated positioning for lumbar and cervical spine.  Pt. noting good relief from heat pack and encouraged to continue using heat at home as needed for upper shoulder relaxation.  Pt. without complaints of back pain or radicular UE/LE symptoms during session today.  Pt. to have cervical MRI next week.  Will continue to benefit from further skilled therapy for improvement in postural awareness with household chores, postural strengthening for improved functional activity tolerance.    Comorbidities  HTN, RSD, angina, GERD    Rehab Potential  Good    PT Treatment/Interventions  ADLs/Self Care Home Management;Cryotherapy;Electrical Stimulation;Iontophoresis 4mg /ml Dexamethasone;Moist Heat;Traction;Ultrasound;DME Instruction;Gait training;Stair training;Functional mobility training;Therapeutic  activities;Therapeutic exercise;Balance training;Neuromuscular re-education;Patient/family education;Manual techniques;Passive range of motion;Dry needling;Taping;Spinal Manipulations;Joint Manipulations    PT Next Visit Plan  spinal & proximal UE/LE flexibility; postural/core strengthening; manual therapy and modalities as indicated    PT Home Exercise Plan  04/27/19 - chin tuck (supine or sitting per pt preference), UT & LS stretches, scap retraction    Consulted and Agree with Plan of Care  Patient       Patient will benefit from skilled therapeutic intervention in order to improve the following deficits and impairments:  Decreased activity tolerance, Decreased balance, Decreased endurance, Decreased knowledge of precautions, Decreased mobility, Decreased range of motion, Decreased safety awareness, Decreased strength, Difficulty walking, Increased fascial restricitons, Increased muscle spasms, Impaired perceived functional ability, Impaired flexibility, Impaired sensation, Impaired UE functional use, Improper body mechanics, Postural dysfunction, Pain  Visit Diagnosis: Radiculopathy, cervical region  Cervicalgia  Abnormal posture  Muscle weakness (generalized)  Cramp and spasm  Chronic bilateral low back pain with bilateral sciatica     Problem List Patient Active Problem List   Diagnosis Date Noted  . Varicose veins of bilateral lower extremities with other complications 99991111  . Abnormal EKG 11/27/2013  . Angina pectoris syndrome ([place]) 11/27/2013  . Hypertension   . Hyperlipidemia   . RSD (reflex sympathetic dystrophy)     Nicole Mcintosh, PTA 05/06/19 1:55 PM   [facility] [address]  [place], [place], [address] Phone: [phone]   Fax:  [phone]  Name: Nicole Mcintosh MRN: [id number] Date of Birth: 09-22-1950

## 2019-05-06 NOTE — Patient Instructions (Signed)
d 

## 2019-05-13 ENCOUNTER — Ambulatory Visit
Admission: RE | Admit: 2019-05-13 | Discharge: 2019-05-13 | Disposition: A | Discharge status: Home / Self Care | Payer: Medicare HMO | Source: Ambulatory Visit | Attending: Neurosurgery | Admitting: Neurosurgery

## 2019-05-13 ENCOUNTER — Other Ambulatory Visit: Payer: Self-pay

## 2019-05-13 DIAGNOSIS — M5126 Other intervertebral disc displacement, lumbar region: Secondary | ICD-10-CM

## 2019-05-13 DIAGNOSIS — M48061 Spinal stenosis, lumbar region without neurogenic claudication: Secondary | ICD-10-CM | POA: Diagnosis not present

## 2019-05-14 ENCOUNTER — Ambulatory Visit: Payer: Medicare HMO

## 2019-05-14 VITALS — BP 140/80 | HR 68

## 2019-05-14 DIAGNOSIS — M542 Cervicalgia: Secondary | ICD-10-CM | POA: Diagnosis not present

## 2019-05-14 DIAGNOSIS — G8929 Other chronic pain: Secondary | ICD-10-CM | POA: Diagnosis not present

## 2019-05-14 DIAGNOSIS — M6281 Muscle weakness (generalized): Secondary | ICD-10-CM | POA: Diagnosis not present

## 2019-05-14 DIAGNOSIS — M5412 Radiculopathy, cervical region: Secondary | ICD-10-CM | POA: Diagnosis not present

## 2019-05-14 DIAGNOSIS — M5442 Lumbago with sciatica, left side: Secondary | ICD-10-CM | POA: Diagnosis not present

## 2019-05-14 DIAGNOSIS — R293 Abnormal posture: Secondary | ICD-10-CM

## 2019-05-14 DIAGNOSIS — R252 Cramp and spasm: Secondary | ICD-10-CM | POA: Diagnosis not present

## 2019-05-14 DIAGNOSIS — M5441 Lumbago with sciatica, right side: Secondary | ICD-10-CM | POA: Diagnosis not present

## 2019-05-14 NOTE — Therapy (Signed)
[facility] [address]  [place], [place], [address] Phone: [phone]   Fax:  [phone]  Physical Therapy Treatment  Patient Details  Name: Nicole Mcintosh MRN: [id number] Date of Birth: 01-03-1951 Referring Provider (PT): Kary Kos, MD   Encounter Date: 05/14/2019  PT End of Session - 05/14/19 1115    Visit Number  4    Number of Visits  16    Date for PT Re-Evaluation  06/22/19    Authorization Type  [employer] Medicare (prior auth required) & Medicaid    Authorization Time Period  [employer] 04/29/19 - 10/28/19    PT Start Time  1105    PT Stop Time  1150    PT Time Calculation (min)  45 min    Activity Tolerance  Patient tolerated treatment well;Patient limited by pain    Behavior During Therapy  [facility] for tasks assessed/performed       Past Medical History:  Diagnosis Date  . Esophageal reflux   . Hematuria   . Hyperlipidemia   . Hypertension   . Impaired fasting glucose   . RSD (reflex sympathetic dystrophy)   . Varicose veins of both lower extremities     Past Surgical History:  Procedure Laterality Date  . ENDOVENOUS ABLATION SAPHENOUS VEIN W/ LASER Right 03/11/2017   endovenous laser ablation right greater saphenous vein by Tinnie Gens MD   . ENDOVENOUS [place] W/ LASER Left 04/01/2017   endovenous laser ablation left greater saphenous vein by Tinnie Gens MD   . LEFT HEART CATHETERIZATION WITH CORONARY ANGIOGRAM N/A 12/16/2013   Procedure: LEFT HEART CATHETERIZATION WITH CORONARY ANGIOGRAM;  Surgeon: Sinclair Grooms, MD;  Location: [facility] CATH LAB;  Service: Cardiovascular;  Laterality: N/A;    Vitals:   05/14/19 1113  BP: 140/80  Pulse: 68  SpO2: 97%    Subjective Assessment - 05/14/19 1113    Subjective  Pt. noting leg cramps and groin cramps on night of 12/26 which woke her from her sleep and lasted 3-4 min.  Notes she has had history of LE cramping over this past year  however had not felt this in a few months.    Diagnostic tests  Cervical MRI 04/20/18:  No acute osseous or cord signal abnormality. Cervical spondylosis with predominantly discogenic degenerative changes at C4-5 through C6-7. Mild C5-6 and C6-7 spinal canal stenosis. No high-grade spinal canal stenosis. Severe left C5-6 foraminal stenosis. Multilevel mild foraminal stenosis.   Lumbar MRI 11/26/17: Moderate left and mild right subarticular stenosis at L4-5 has progressed secondary to leftward disc protrusion and bilateral facet hypertrophy. Mild foraminal narrowing at L4-5 is worse on the right. Progressive advanced facet hypertrophy at L3-4 with mild right foraminal narrowing. Progressive facet degenerative changes bilaterally at L2-3 is worse on the right. There is encroachment on the central canal without focal stenosis. (new lumbar MRI scheduled for 05/13/19)    Patient Stated Goals  "be able to walk as long as I want, bend over, pick things up and reach overhead"    Currently in Pain?  No/denies    Pain Score  0-No pain    Pain Location  Shoulder    Pain Orientation  Upper;Right    Pain Type  Chronic pain    Pain Onset  More than a month ago    Pain Frequency  Intermittent    Aggravating Factors   exercises    Multiple Pain Sites  Yes  Pain Score  8    Pain Location  Groin    Pain Orientation  Right;Left   R>L   Pain Descriptors / Indicators  --   " pulling";  severe "cramping" on night of 12/26   Pain Type  Chronic pain    Pain Onset  More than a month ago    Pain Frequency  Constant    Aggravating Factors   unsure         [facility] PT Assessment - 05/14/19 0001      Assessment   Medical Diagnosis  Cervical & lumbar radicular radiculopathy    Referring Provider (PT)  Kary Kos, MD    Hand Dominance  Right    Next MD Visit  05/19/19    Prior Therapy  yes, predominantly for lumbar/core                   [facility] Adult PT Treatment/Exercise - 05/14/19 0001      Self-Care    Self-Care  Other Self-Care Comments    Other Self-Care Comments   discussed nature of pt. cramping in her groin muscles; discussed general rationale behind stretching groin musculature during cramps to reduce duration of cramping       Neck Exercises: Machines for Strengthening   Nustep  Lvl 2, 34min (UE/LE)      Neck Exercises: Seated   Neck Retraction  10 reps;5 secs    Neck Retraction Limitations  good technique without cueing     Money  10 reps;3 secs    Money Limitations  seated     Shoulder Rolls  Backwards;10 reps    Shoulder Rolls Limitations  seated      Lumbar Exercises: Stretches   Other Lumbar Stretch Exercise   B groin cramping 2 x 30 sec supine and seated              PT Education - 05/14/19 1209    Education Details  HEP update;  groin stretches supine in "frog stretch" and seated with knees bent (feet on floor) and B hands pushing out on knees    Person(s) Educated  Patient    Methods  Explanation;Demonstration;Verbal cues;Handout    Comprehension  Verbalized understanding;Returned demonstration;Verbal cues required       PT Short Term Goals - 05/05/19 0939      PT SHORT TERM GOAL #1   Title  Patient will be independent with initial HEP    Status  On-going    Target Date  05/25/19      PT SHORT TERM GOAL #2   Title  Patient will verbalize/demonstrate understanding of neutral spine posture and proper body mechanics to reduce strain on cervical and lumbar spine    Status  On-going    Target Date  05/25/19        PT Long Term Goals - 05/05/19 1252      PT LONG TERM GOAL #1   Title  Patient will be independent with ongoing/advanced HEP    Status  On-going      PT LONG TERM GOAL #2   Title  Patient to demonstrate appropriate posture and body mechanics needed for daily activities    Status  On-going      PT LONG TERM GOAL #3   Title  Patient to improve cervical AROM to WFL/WNL without pain provocation    Status  On-going      PT LONG TERM GOAL  #4   Title  Patient to report  cervical pain and UE radiculopathy reduction in frequency and intensity by >/= 50%    Status  On-going      PT LONG TERM GOAL #5   Title  Patient to report ability to perform ADLs and household tasks without increased pain    Status  On-going            Plan - 05/14/19 1121    Clinical Impression Statement  Pt. noting a feeling of "clamminess" and mild shortness of breath after initial NuStep warmup today however notes this is not unusual for her with exercise.  Vitals recorded with BP, HR, O2 saturation all WFL.  Pt. primary complaint today was groin cramping which she had the night of 05/09/19 which woke her up at night and lasted 3-4 min.  Pt. feeling sensation of "pulling" at 8/10 average discomfort since 12/26 in B groin musculature.  Reviewed proper stretching rationale to relieve cramping and instructed pt. in proper supine and seated groin stretching (see pt. education) which was tolerated well.  Ended session with general postural strengthening and introduction into "no money" and reverse shoulder roll exercises which were tolerated well.  Will plan to monitor tolerance to updated HEP and monitor incidence of future LE cramping in coming sessions. Pt. noting MD f/u upcoming on 05/19/19 and notes cervical HEP going well.    Personal Factors and Comorbidities  Comorbidity 3+;Age;Past/Current Experience;Time since onset of injury/illness/exacerbation;Fitness    Comorbidities  HTN, RSD, angina, GERD    Rehab Potential  Good    PT Treatment/Interventions  ADLs/Self Care Home Management;Cryotherapy;Electrical Stimulation;Iontophoresis 4mg /ml Dexamethasone;Moist Heat;Traction;Ultrasound;DME Instruction;Gait training;Stair training;Functional mobility training;Therapeutic activities;Therapeutic exercise;Balance training;Neuromuscular re-education;Patient/family education;Manual techniques;Passive range of motion;Dry needling;Taping;Spinal Manipulations;Joint  Manipulations    PT Home Exercise Plan  04/27/19 - chin tuck (supine or sitting per pt preference), UT & LS stretches, scap retraction;  05/14/19:  issued groin stretch in supine and seated prn for cramping at night    Consulted and Agree with Plan of Care  Patient       Patient will benefit from skilled therapeutic intervention in order to improve the following deficits and impairments:  Decreased activity tolerance, Decreased balance, Decreased endurance, Decreased knowledge of precautions, Decreased mobility, Decreased range of motion, Decreased safety awareness, Decreased strength, Difficulty walking, Increased fascial restricitons, Increased muscle spasms, Impaired perceived functional ability, Impaired flexibility, Impaired sensation, Impaired UE functional use, Improper body mechanics, Postural dysfunction, Pain  Visit Diagnosis: Radiculopathy, cervical region  Cervicalgia  Abnormal posture  Muscle weakness (generalized)  Cramp and spasm  Chronic bilateral low back pain with bilateral sciatica     Problem List Patient Active Problem List   Diagnosis Date Noted  . Varicose veins of bilateral lower extremities with other complications 99991111  . Abnormal EKG 11/27/2013  . Angina pectoris syndrome ([place]) 11/27/2013  . Hypertension   . Hyperlipidemia   . RSD (reflex sympathetic dystrophy)     Bess Harvest, PTA 05/14/19 12:25 PM   [facility] [address]  [place], [place], [address] Phone: [phone]   Fax:  [phone]  Name: MUMINA HIGHBAUGH MRN: [id number] Date of Birth: 1951-03-30

## 2019-05-18 ENCOUNTER — Encounter: Payer: Self-pay | Admitting: Physical Therapy

## 2019-05-18 ENCOUNTER — Other Ambulatory Visit: Payer: Self-pay

## 2019-05-18 ENCOUNTER — Ambulatory Visit: Payer: Medicare HMO | Attending: Neurosurgery | Admitting: Physical Therapy

## 2019-05-18 DIAGNOSIS — M6281 Muscle weakness (generalized): Secondary | ICD-10-CM | POA: Diagnosis not present

## 2019-05-18 DIAGNOSIS — R252 Cramp and spasm: Secondary | ICD-10-CM | POA: Diagnosis not present

## 2019-05-18 DIAGNOSIS — G8929 Other chronic pain: Secondary | ICD-10-CM | POA: Insufficient documentation

## 2019-05-18 DIAGNOSIS — M5412 Radiculopathy, cervical region: Secondary | ICD-10-CM | POA: Insufficient documentation

## 2019-05-18 DIAGNOSIS — M5441 Lumbago with sciatica, right side: Secondary | ICD-10-CM | POA: Insufficient documentation

## 2019-05-18 DIAGNOSIS — M5442 Lumbago with sciatica, left side: Secondary | ICD-10-CM | POA: Insufficient documentation

## 2019-05-18 DIAGNOSIS — M542 Cervicalgia: Secondary | ICD-10-CM | POA: Diagnosis not present

## 2019-05-18 DIAGNOSIS — R293 Abnormal posture: Secondary | ICD-10-CM | POA: Diagnosis not present

## 2019-05-18 NOTE — Therapy (Signed)
[facility] [address]  [place], [place], [address] Phone: [phone]   Fax:  [phone]  Physical Therapy Treatment  Patient Details  Name: Nicole Mcintosh MRN: [id number] Date of Birth: 08-Aug-1950 Referring Provider (PT): Kary Kos, MD   Encounter Date: 05/18/2019  PT End of Session - 05/18/19 1452    Visit Number  5    Number of Visits  16    Date for PT Re-Evaluation  06/22/19    Authorization Type  [employer] Medicare & Medicaid    Authorization Time Period  [employer] 04/29/19 - 10/28/19    PT Start Time  1452    PT Stop Time  1537    PT Time Calculation (min)  45 min    Activity Tolerance  Patient tolerated treatment well    Behavior During Therapy  [facility] for tasks assessed/performed       Past Medical History:  Diagnosis Date  . Esophageal reflux   . Hematuria   . Hyperlipidemia   . Hypertension   . Impaired fasting glucose   . RSD (reflex sympathetic dystrophy)   . Varicose veins of both lower extremities     Past Surgical History:  Procedure Laterality Date  . ENDOVENOUS ABLATION SAPHENOUS VEIN W/ LASER Right 03/11/2017   endovenous laser ablation right greater saphenous vein by Tinnie Gens MD   . ENDOVENOUS [place] W/ LASER Left 04/01/2017   endovenous laser ablation left greater saphenous vein by Tinnie Gens MD   . LEFT HEART CATHETERIZATION WITH CORONARY ANGIOGRAM N/A 12/16/2013   Procedure: LEFT HEART CATHETERIZATION WITH CORONARY ANGIOGRAM;  Surgeon: Sinclair Grooms, MD;  Location: [facility] CATH LAB;  Service: Cardiovascular;  Laterality: N/A;    There were no vitals filed for this visit.  Subjective Assessment - 05/18/19 1455    Subjective  Pt noting less pain overall and more just post-exercise muscle soreness.    Diagnostic tests  Cervical MRI 04/20/18:  No acute osseous or cord signal abnormality. Cervical spondylosis with predominantly discogenic degenerative changes at C4-5  through C6-7. Mild C5-6 and C6-7 spinal canal stenosis. No high-grade spinal canal stenosis. Severe left C5-6 foraminal stenosis. Multilevel mild foraminal stenosis.   Lumbar MRI 11/26/17: Moderate left and mild right subarticular stenosis at L4-5 has progressed secondary to leftward disc protrusion and bilateral facet hypertrophy. Mild foraminal narrowing at L4-5 is worse on the right. Progressive advanced facet hypertrophy at L3-4 with mild right foraminal narrowing. Progressive facet degenerative changes bilaterally at L2-3 is worse on the right. There is encroachment on the central canal without focal stenosis. (new lumbar MRI scheduled for 05/13/19)    Patient Stated Goals  "be able to walk as long as I want, bend over, pick things up and reach overhead"    Currently in Pain?  No/denies         [facility] PT Assessment - 05/18/19 1452      Assessment   Medical Diagnosis  Cervical & lumbar radicular radiculopathy    Referring Provider (PT)  Kary Kos, MD    Onset Date/Surgical Date  --   >1 yr   Hand Dominance  Right    Next MD Visit  05/19/19      AROM   Right Shoulder Flexion  131 Degrees    Right Shoulder ABduction  131 Degrees    Left Shoulder Flexion  148 Degrees    Left Shoulder ABduction  140 Degrees    Cervical  Flexion  45 dg    Cervical Extension  55 dg    Cervical - Right Side Bend  25 dg    Cervical - Left Side Bend  26 dg - pulling    Cervical - Right Rotation  65 dg    Cervical - Left Rotation  60 dg    Lumbar Flexion  hands to ankles - tightness    Lumbar Extension  50% limited - pulling/tightness    Lumbar - Right Side Bend  hand to lateral knee joint line - tight    Lumbar - Left Side Bend  hand to lateral knee joint line - tight    Lumbar - Right Rotation  WFL    Lumbar - Left Rotation  [facility]      Strength   Right Shoulder Flexion  4/5    Right Shoulder ABduction  4/5    Right Shoulder Internal Rotation  4+/5    Right Shoulder External Rotation  4+/5    Left  Shoulder Flexion  4/5    Left Shoulder ABduction  4/5    Left Shoulder Internal Rotation  4+/5    Left Shoulder External Rotation  4+/5    Right Hip Flexion  4/5    Right Hip Extension  4/5    Right Hip External Rotation   4-/5    Right Hip Internal Rotation  4/5    Right Hip ABduction  4/5    Right Hip ADduction  4-/5    Left Hip Flexion  4/5    Left Hip External Rotation  4-/5    Left Hip Internal Rotation  4-/5    Left Hip ABduction  4-/5    Left Hip ADduction  4-/5    Right Knee Flexion  4/5    Right Knee Extension  4/5    Left Knee Flexion  4/5    Left Knee Extension  4/5                   [facility] Adult PT Treatment/Exercise - 05/18/19 1452      Exercises   Exercises  Neck      Neck Exercises: Machines for Strengthening   Nustep  L3 x 6 min (UE/LE)      Lumbar Exercises: Stretches   Passive Hamstring Stretch  Right;Left;30 seconds;2 reps    Passive Hamstring Stretch Limitations  seated hip hinge      Lumbar Exercises: Supine   Ab Set  10 reps;5 seconds    Clam  10 reps;3 seconds    Clam Limitations  abd bracing + hooklying bent knee fall-outs    Bent Knee Raise Limitations  deferred in favor of isometric due to increased pain    Dead Bug  10 reps;3 seconds    Dead Bug Limitations  abd bracing + alt LE extension (LE only dead bug)    Large Ball Abdominal Isometric  10 reps;3 seconds    Large Ball Abdominal Isometric Limitations  brace marching with hip flexion isometric into orange Pball               PT Short Term Goals - 05/18/19 1458      PT SHORT TERM GOAL #1   Title  Patient will be independent with initial HEP    Status  Achieved   05/18/19   Target Date  --      PT SHORT TERM GOAL #2   Title  Patient will verbalize/demonstrate understanding of neutral spine posture and  proper body mechanics to reduce strain on cervical and lumbar spine    Status  Achieved   05/18/19   Target Date  --        PT Long Term Goals - 05/18/19 1518       PT LONG TERM GOAL #1   Title  Patient will be independent with ongoing/advanced HEP    Status  Partially Met      PT LONG TERM GOAL #2   Title  Patient to demonstrate appropriate posture and body mechanics needed for daily activities    Status  On-going      PT LONG TERM GOAL #3   Title  Patient to improve cervical AROM to WFL/WNL without pain provocation    Status  On-going      PT LONG TERM GOAL #4   Title  Patient to report cervical pain and UE radiculopathy reduction in frequency and intensity by >/= 50%    Status  On-going      PT LONG TERM GOAL #5   Title  Patient to report ability to perform ADLs and household tasks without increased pain    Status  On-going            Plan - 05/18/19 1459    Clinical Impression Statement  Nicole Mcintosh reporting 35% improvement in overall neck and low back pain with 40% reduction in UE radiculopathy symptoms since start of PT. Significant improvements noted in cervical and B UE AROM with less change noted in lumbar AROM but decreased pain reported. B UE and LE strength improving by at least  MMT grade on average. She reports good comfort and compliance with current HEP and verbalizes good understanding of proper posture and body mechanics and is trying to incorporate this into her daily activities. All STGs now met and good initial progress observed toward LTGs. Nicole Mcintosh will continue to benefit from skilled PT to further reduce neck and low back pain and radicular symptoms to allow return to normal daily activities w/o pain interference.    Personal Factors and Comorbidities  Comorbidity 3+;Age;Past/Current Experience;Time since onset of injury/illness/exacerbation;Fitness    Comorbidities  HTN, RSD, angina, GERD    Examination-Activity Limitations  Bathing;Bend;Carry;Continence;Dressing;Hygiene/Grooming;Lift;Locomotion Level;Reach Overhead;Sit;Sleep;Squat;Stairs;Stand;Toileting;Transfers    Examination-Participation Restrictions   Cleaning;Community Activity;Laundry;Meal Prep;Shop    Rehab Potential  Good    PT Frequency  2x / week    PT Duration  8 weeks    PT Treatment/Interventions  ADLs/Self Care Home Management;Cryotherapy;Electrical Stimulation;Iontophoresis 41m/ml Dexamethasone;Moist Heat;Traction;Ultrasound;DME Instruction;Gait training;Stair training;Functional mobility training;Therapeutic activities;Therapeutic exercise;Balance training;Neuromuscular re-education;Patient/family education;Manual techniques;Passive range of motion;Dry needling;Taping;Spinal Manipulations;Joint Manipulations    PT Next Visit Plan  spinal & proximal UE/LE flexibility; postural/core strengthening; manual therapy and modalities as indicated    PT Home Exercise Plan  04/27/19 - chin tuck (supine or sitting per pt preference), UT & LS stretches, scap retraction;  05/14/19: issued groin stretch in supine and seated prn for cramping at night    Consulted and Agree with Plan of Care  Patient       Patient will benefit from skilled therapeutic intervention in order to improve the following deficits and impairments:  Decreased activity tolerance, Decreased balance, Decreased endurance, Decreased knowledge of precautions, Decreased mobility, Decreased range of motion, Decreased safety awareness, Decreased strength, Difficulty walking, Increased fascial restricitons, Increased muscle spasms, Impaired perceived functional ability, Impaired flexibility, Impaired sensation, Impaired UE functional use, Improper body mechanics, Postural dysfunction, Pain  Visit Diagnosis: Radiculopathy, cervical region  Cervicalgia  Abnormal posture  Muscle  weakness (generalized)  Cramp and spasm  Chronic bilateral low back pain with bilateral sciatica     Problem List Patient Active Problem List   Diagnosis Date Noted  . Varicose veins of bilateral lower extremities with other complications 44/39/2659  . Abnormal EKG 11/27/2013  . Angina pectoris  syndrome ([place]) 11/27/2013  . Hypertension   . Hyperlipidemia   . RSD (reflex sympathetic dystrophy)     Percival Spanish, PT, MPT 05/18/2019, 5:03 PM  [facility] [address]  [place], [place], [address] Phone: [phone]   Fax:  [phone]  Name: Nicole Mcintosh MRN: [id number] Date of Birth: 1950-09-13

## 2019-05-19 DIAGNOSIS — M4316 Spondylolisthesis, lumbar region: Secondary | ICD-10-CM | POA: Diagnosis not present

## 2019-05-19 DIAGNOSIS — M48062 Spinal stenosis, lumbar region with neurogenic claudication: Secondary | ICD-10-CM | POA: Diagnosis not present

## 2019-05-20 ENCOUNTER — Ambulatory Visit: Payer: Medicare HMO

## 2019-05-20 ENCOUNTER — Other Ambulatory Visit: Payer: Self-pay

## 2019-05-20 DIAGNOSIS — R293 Abnormal posture: Secondary | ICD-10-CM

## 2019-05-20 DIAGNOSIS — M5442 Lumbago with sciatica, left side: Secondary | ICD-10-CM

## 2019-05-20 DIAGNOSIS — G8929 Other chronic pain: Secondary | ICD-10-CM | POA: Diagnosis not present

## 2019-05-20 DIAGNOSIS — R252 Cramp and spasm: Secondary | ICD-10-CM | POA: Diagnosis not present

## 2019-05-20 DIAGNOSIS — M542 Cervicalgia: Secondary | ICD-10-CM | POA: Diagnosis not present

## 2019-05-20 DIAGNOSIS — M5412 Radiculopathy, cervical region: Secondary | ICD-10-CM

## 2019-05-20 DIAGNOSIS — M6281 Muscle weakness (generalized): Secondary | ICD-10-CM | POA: Diagnosis not present

## 2019-05-20 DIAGNOSIS — M5441 Lumbago with sciatica, right side: Secondary | ICD-10-CM | POA: Diagnosis not present

## 2019-05-20 NOTE — Therapy (Addendum)
[facility] [address]  [place], [place], [address] Phone: [phone]   Fax:  [phone]  Physical Therapy Treatment / Discharge Summary  Patient Details  Name: Nicole Mcintosh MRN: [id number] Date of Birth: [birth year] Referring Provider (PT): Kary Kos, MD   Encounter Date: 05/20/2019  PT End of Session - 05/20/19 1327    Visit Number  6    Number of Visits  16    Date for PT Re-Evaluation  06/22/19    Authorization Type  [employer] Medicaid    Authorization Time Period  [employer] 04/29/19 - 10/28/19    PT Start Time  1322    PT Stop Time  1405    PT Time Calculation (min)  43 min    Activity Tolerance  Patient tolerated treatment well    Behavior During Therapy  [facility] for tasks assessed/performed       Past Medical History:  Diagnosis Date  . Esophageal reflux   . Hematuria   . Hyperlipidemia   . Hypertension   . Impaired fasting glucose   . RSD (reflex sympathetic dystrophy)   . Varicose veins of both lower extremities     Past Surgical History:  Procedure Laterality Date  . ENDOVENOUS ABLATION SAPHENOUS VEIN W/ LASER Right 03/11/2017   endovenous laser ablation right greater saphenous vein by Tinnie Gens MD   . ENDOVENOUS [place] W/ LASER Left 04/01/2017   endovenous laser ablation left greater saphenous vein by Tinnie Gens MD   . LEFT HEART CATHETERIZATION WITH CORONARY ANGIOGRAM N/A 12/16/2013   Procedure: LEFT HEART CATHETERIZATION WITH CORONARY ANGIOGRAM;  Surgeon: Sinclair Grooms, MD;  Location: [facility] CATH LAB;  Service: Cardiovascular;  Laterality: N/A;    There were no vitals filed for this visit.  Subjective Assessment - 05/20/19 1326    Subjective  Pt. primary complaint is neck stiffness today.    Diagnostic tests  Cervical MRI 04/20/18:  No acute osseous or cord signal abnormality. Cervical spondylosis with predominantly discogenic degenerative changes at C4-5 through  C6-7. Mild C5-6 and C6-7 spinal canal stenosis. No high-grade spinal canal stenosis. Severe left C5-6 foraminal stenosis. Multilevel mild foraminal stenosis.   Lumbar MRI 11/26/17: Moderate left and mild right subarticular stenosis at L4-5 has progressed secondary to leftward disc protrusion and bilateral facet hypertrophy. Mild foraminal narrowing at L4-5 is worse on the right. Progressive advanced facet hypertrophy at L3-4 with mild right foraminal narrowing. Progressive facet degenerative changes bilaterally at L2-3 is worse on the right. There is encroachment on the central canal without focal stenosis. (new lumbar MRI scheduled for 05/13/19)    Patient Stated Goals  "be able to walk as long as I want, bend over, pick things up and reach overhead"    Currently in Pain?  Yes    Pain Score  5     Pain Location  Neck    Pain Descriptors / Indicators  --   "stiffness"   Pain Type  Chronic pain    Multiple Pain Sites  No                       [facility] Adult PT Treatment/Exercise - 05/20/19 0001      Neck Exercises: Machines for Strengthening   UBE (Upper Arm Bike)  UBE: lvl 1.0, 3 min forwards/ 3 backwards       Neck Exercises: Theraband   Shoulder Extension  10 reps  1" hold    Shoulder Extension Limitations  yellow TB     Rows  10 reps   3" scap. retraction/depression hold    Rows Limitations  yellow TB       Neck Exercises: Seated   Neck Retraction  10 reps;5 secs    Neck Retraction Limitations  yellow TB resistance at back of head      Lumbar Exercises: Supine   Dead Bug  10 reps;3 seconds    Dead Bug Limitations  abd bracing + alt LE extension (LE only dead bug)      Neck Exercises: Stretches   Upper Trapezius Stretch  Right;Left;30 seconds;2 reps    Upper Trapezius Stretch Limitations  hands anchored on mat table     Levator Stretch  Right;Left;30 seconds;2 reps    Levator Stretch Limitations  hands anchored on mat table     Corner Stretch  2 reps;30 seconds     Corner Stretch Limitations  low in doorway    cues for abdom. bracing required as initially with LBP            PT Education - 05/20/19 1411    Education Details  HEP update;  doorway low pec stretch (+ abdom. bracing),  yellow TB issued to pt. for row, shld extension, deadbug (LE only)    Person(s) Educated  Patient    Methods  Explanation;Demonstration;Verbal cues;Handout    Comprehension  Verbalized understanding;Returned demonstration;Verbal cues required       PT Short Term Goals - 05/18/19 1458      PT SHORT TERM GOAL #1   Title  Patient will be independent with initial HEP    Status  Achieved   05/18/19   Target Date  --      PT SHORT TERM GOAL #2   Title  Patient will verbalize/demonstrate understanding of neutral spine posture and proper body mechanics to reduce strain on cervical and lumbar spine    Status  Achieved   05/18/19   Target Date  --        PT Long Term Goals - 05/18/19 1518      PT LONG TERM GOAL #1   Title  Patient will be independent with ongoing/advanced HEP    Status  Partially Met      PT LONG TERM GOAL #2   Title  Patient to demonstrate appropriate posture and body mechanics needed for daily activities    Status  On-going      PT LONG TERM GOAL #3   Title  Patient to improve cervical AROM to WFL/WNL without pain provocation    Status  On-going      PT LONG TERM GOAL #4   Title  Patient to report cervical pain and UE radiculopathy reduction in frequency and intensity by >/= 50%    Status  On-going      PT LONG TERM GOAL #5   Title  Patient to report ability to perform ADLs and household tasks without increased pain    Status  On-going            Plan - 05/20/19 1329    Clinical Impression Statement  Anitha denies soreness after last session.  Therex today focused on improving cervical side bending ROM and postural strength.  Able to progress to yellow TB resisted chin tuck, scapular retraction row, shoulder extension/row today  with good carryover from cueing for proper technique.  These activities tolerated well today thus HEP updated and handout issued  to pt. who verbalized understanding.  [place] reporting she purchased easel for improved posture awareness during reading.  Will monitor tolerance for updated HEP in coming sessions.    Examination-Activity Limitations  Bathing;Bend;Carry;Continence;Dressing;Hygiene/Grooming;Lift;Locomotion Level;Reach Overhead;Sit;Sleep;Squat;Stairs;Stand;Toileting;Transfers    Rehab Potential  Good    PT Treatment/Interventions  ADLs/Self Care Home Management;Cryotherapy;Electrical Stimulation;Iontophoresis 31m/ml Dexamethasone;Moist Heat;Traction;Ultrasound;DME Instruction;Gait training;Stair training;Functional mobility training;Therapeutic activities;Therapeutic exercise;Balance training;Neuromuscular re-education;Patient/family education;Manual techniques;Passive range of motion;Dry needling;Taping;Spinal Manipulations;Joint Manipulations    PT Next Visit Plan  spinal & proximal UE/LE flexibility; postural/core strengthening; manual therapy and modalities as indicated    PT Home Exercise Plan  04/27/19 - chin tuck (supine or sitting per pt preference), UT & LS stretches, scap retraction;  05/14/19: issued groin stretch in supine and seated prn for cramping at night;  05/20/19 - doorway low pec stretch (+ abdom. bracing),  yellow TB issued to pt. for row, shld extension, deadbug (LE only)    Consulted and Agree with Plan of Care  Patient       Patient will benefit from skilled therapeutic intervention in order to improve the following deficits and impairments:  Decreased activity tolerance, Decreased balance, Decreased endurance, Decreased knowledge of precautions, Decreased mobility, Decreased range of motion, Decreased safety awareness, Decreased strength, Difficulty walking, Increased fascial restricitons, Increased muscle spasms, Impaired perceived functional ability, Impaired flexibility,  Impaired sensation, Impaired UE functional use, Improper body mechanics, Postural dysfunction, Pain  Visit Diagnosis: Radiculopathy, cervical region  Cervicalgia  Abnormal posture  Muscle weakness (generalized)  Cramp and spasm  Chronic bilateral low back pain with bilateral sciatica     Problem List Patient Active Problem List   Diagnosis Date Noted  . Varicose veins of bilateral lower extremities with other complications 062/07/5595 . Abnormal EKG 11/27/2013  . Angina pectoris syndrome (HComunas 11/27/2013  . Hypertension   . Hyperlipidemia   . RSD (reflex sympathetic dystrophy)     MBess Harvest PTA 05/20/19 2:33 PM   [facility] [address] [place] [address]Phone: [phone]  Fax:  [phone] Name: BNIKYAH LACKMANMRN: [id number] of Birth: 2October 12, 1952 PHYSICAL THERAPY DISCHARGE SUMMARY  Visits from Start of Care: 6  Current functional level related to goals / functional outcomes:   Refer to above clinical impression for status as of last visit on 05/20/2019. Patient cancelled multiple appointments in January due to undergoing treatment for C. Diff and has not returned to PT in >30 days, therefore will proceed with discharge from PT for this episode.   Remaining deficits:   As above. Unable to formally assess status at discharge due to failure to return to PT.   Education / Equipment:   HEP  Plan: Patient agrees to discharge.  Patient goals were partially met. Patient is being discharged due to not returning since the last visit.  ?????     JPercival Spanish PT, MPT 07/24/19, 9:12 AM  [facility] [place] [address]Phone: [phone]  Fax:  [phone]

## 2019-05-25 ENCOUNTER — Other Ambulatory Visit: Payer: Self-pay | Admitting: Gastroenterology

## 2019-05-25 ENCOUNTER — Ambulatory Visit: Payer: Medicare HMO | Admitting: Physical Therapy

## 2019-05-25 DIAGNOSIS — R1084 Generalized abdominal pain: Secondary | ICD-10-CM | POA: Diagnosis not present

## 2019-05-25 DIAGNOSIS — R197 Diarrhea, unspecified: Secondary | ICD-10-CM

## 2019-05-25 DIAGNOSIS — A0471 Enterocolitis due to Clostridium difficile, recurrent: Secondary | ICD-10-CM

## 2019-05-25 DIAGNOSIS — R112 Nausea with vomiting, unspecified: Secondary | ICD-10-CM

## 2019-05-27 ENCOUNTER — Ambulatory Visit: Payer: Medicare HMO

## 2019-06-04 ENCOUNTER — Ambulatory Visit
Admission: RE | Admit: 2019-06-04 | Discharge: 2019-06-04 | Disposition: A | Discharge status: Home / Self Care | Payer: Medicare HMO | Source: Ambulatory Visit | Attending: Gastroenterology | Admitting: Gastroenterology

## 2019-06-04 ENCOUNTER — Other Ambulatory Visit: Payer: Self-pay

## 2019-06-04 DIAGNOSIS — R1084 Generalized abdominal pain: Secondary | ICD-10-CM

## 2019-06-04 DIAGNOSIS — R197 Diarrhea, unspecified: Secondary | ICD-10-CM

## 2019-06-04 DIAGNOSIS — A0471 Enterocolitis due to Clostridium difficile, recurrent: Secondary | ICD-10-CM

## 2019-06-04 DIAGNOSIS — R112 Nausea with vomiting, unspecified: Secondary | ICD-10-CM

## 2019-06-04 DIAGNOSIS — K5190 Ulcerative colitis, unspecified, without complications: Secondary | ICD-10-CM | POA: Diagnosis not present

## 2019-06-04 MED ORDER — IOPAMIDOL (ISOVUE-300) INJECTION 61%
100.0000 mL | Freq: Once | INTRAVENOUS | Status: AC | PRN
  Administered 2019-06-04: 100 mL via INTRAVENOUS

## 2019-06-08 ENCOUNTER — Ambulatory Visit: Payer: Medicare HMO

## 2019-07-01 DIAGNOSIS — R197 Diarrhea, unspecified: Secondary | ICD-10-CM | POA: Diagnosis not present

## 2019-07-01 DIAGNOSIS — Z8619 Personal history of other infectious and parasitic diseases: Secondary | ICD-10-CM | POA: Diagnosis not present

## 2019-07-07 DIAGNOSIS — I10 Essential (primary) hypertension: Secondary | ICD-10-CM | POA: Diagnosis not present

## 2019-07-07 DIAGNOSIS — R7309 Other abnormal glucose: Secondary | ICD-10-CM | POA: Diagnosis not present

## 2019-07-07 DIAGNOSIS — G9050 Complex regional pain syndrome I, unspecified: Secondary | ICD-10-CM | POA: Diagnosis not present

## 2019-07-07 DIAGNOSIS — E782 Mixed hyperlipidemia: Secondary | ICD-10-CM | POA: Diagnosis not present

## 2019-07-07 DIAGNOSIS — Z0000 Encounter for general adult medical examination without abnormal findings: Secondary | ICD-10-CM | POA: Diagnosis not present

## 2019-07-07 DIAGNOSIS — Z8619 Personal history of other infectious and parasitic diseases: Secondary | ICD-10-CM | POA: Diagnosis not present

## 2019-07-07 DIAGNOSIS — R197 Diarrhea, unspecified: Secondary | ICD-10-CM | POA: Diagnosis not present

## 2019-07-07 DIAGNOSIS — N3281 Overactive bladder: Secondary | ICD-10-CM | POA: Diagnosis not present

## 2019-07-07 DIAGNOSIS — F322 Major depressive disorder, single episode, severe without psychotic features: Secondary | ICD-10-CM | POA: Diagnosis not present

## 2019-07-07 DIAGNOSIS — A0472 Enterocolitis due to Clostridium difficile, not specified as recurrent: Secondary | ICD-10-CM | POA: Diagnosis not present

## 2019-07-24 DIAGNOSIS — R7301 Impaired fasting glucose: Secondary | ICD-10-CM | POA: Diagnosis not present

## 2019-07-24 DIAGNOSIS — A0472 Enterocolitis due to Clostridium difficile, not specified as recurrent: Secondary | ICD-10-CM | POA: Diagnosis not present

## 2019-07-24 DIAGNOSIS — I10 Essential (primary) hypertension: Secondary | ICD-10-CM | POA: Diagnosis not present

## 2019-07-24 DIAGNOSIS — E782 Mixed hyperlipidemia: Secondary | ICD-10-CM | POA: Diagnosis not present

## 2019-07-29 DIAGNOSIS — Z1159 Encounter for screening for other viral diseases: Secondary | ICD-10-CM | POA: Diagnosis not present

## 2019-07-31 DIAGNOSIS — R197 Diarrhea, unspecified: Secondary | ICD-10-CM | POA: Diagnosis not present

## 2019-07-31 DIAGNOSIS — R140 Abdominal distension (gaseous): Secondary | ICD-10-CM | POA: Diagnosis not present

## 2019-07-31 DIAGNOSIS — R110 Nausea: Secondary | ICD-10-CM | POA: Diagnosis not present

## 2019-07-31 DIAGNOSIS — Q399 Congenital malformation of esophagus, unspecified: Secondary | ICD-10-CM | POA: Diagnosis not present

## 2019-07-31 DIAGNOSIS — K3189 Other diseases of stomach and duodenum: Secondary | ICD-10-CM | POA: Diagnosis not present

## 2019-08-04 DIAGNOSIS — K3189 Other diseases of stomach and duodenum: Secondary | ICD-10-CM | POA: Diagnosis not present

## 2019-08-18 DIAGNOSIS — R110 Nausea: Secondary | ICD-10-CM | POA: Diagnosis not present

## 2019-08-18 DIAGNOSIS — R194 Change in bowel habit: Secondary | ICD-10-CM | POA: Diagnosis not present

## 2019-09-29 DIAGNOSIS — M48062 Spinal stenosis, lumbar region with neurogenic claudication: Secondary | ICD-10-CM | POA: Diagnosis not present

## 2019-10-06 ENCOUNTER — Ambulatory Visit: Payer: Medicare HMO | Admitting: Physical Therapy

## 2019-10-16 ENCOUNTER — Other Ambulatory Visit: Payer: Self-pay | Admitting: Family Medicine

## 2019-10-16 DIAGNOSIS — L723 Sebaceous cyst: Secondary | ICD-10-CM | POA: Diagnosis not present

## 2019-10-16 DIAGNOSIS — R911 Solitary pulmonary nodule: Secondary | ICD-10-CM

## 2019-10-19 ENCOUNTER — Ambulatory Visit: Payer: Medicare HMO | Admitting: Physical Therapy

## 2019-11-02 ENCOUNTER — Ambulatory Visit: Payer: Medicare HMO | Admitting: Physical Therapy

## 2019-11-10 ENCOUNTER — Ambulatory Visit
Admission: RE | Admit: 2019-11-10 | Discharge: 2019-11-10 | Disposition: A | Discharge status: Home / Self Care | Payer: Medicare HMO | Source: Ambulatory Visit | Attending: Family Medicine | Admitting: Family Medicine

## 2019-11-10 ENCOUNTER — Other Ambulatory Visit: Payer: Self-pay

## 2019-11-10 DIAGNOSIS — R911 Solitary pulmonary nodule: Secondary | ICD-10-CM

## 2019-11-10 DIAGNOSIS — R918 Other nonspecific abnormal finding of lung field: Secondary | ICD-10-CM | POA: Diagnosis not present

## 2019-12-18 ENCOUNTER — Other Ambulatory Visit: Payer: Self-pay | Admitting: Family Medicine

## 2019-12-18 DIAGNOSIS — Z1231 Encounter for screening mammogram for malignant neoplasm of breast: Secondary | ICD-10-CM

## 2019-12-29 ENCOUNTER — Ambulatory Visit: Payer: Medicare HMO

## 2020-01-12 ENCOUNTER — Ambulatory Visit: Payer: Medicare HMO

## 2020-01-19 ENCOUNTER — Other Ambulatory Visit: Payer: Self-pay

## 2020-01-19 ENCOUNTER — Ambulatory Visit
Admission: RE | Admit: 2020-01-19 | Discharge: 2020-01-19 | Disposition: A | Discharge status: Home / Self Care | Payer: Medicare HMO | Source: Ambulatory Visit | Attending: Family Medicine | Admitting: Family Medicine

## 2020-01-19 DIAGNOSIS — R7301 Impaired fasting glucose: Secondary | ICD-10-CM | POA: Diagnosis not present

## 2020-01-19 DIAGNOSIS — G9050 Complex regional pain syndrome I, unspecified: Secondary | ICD-10-CM | POA: Diagnosis not present

## 2020-01-19 DIAGNOSIS — I10 Essential (primary) hypertension: Secondary | ICD-10-CM | POA: Diagnosis not present

## 2020-01-19 DIAGNOSIS — Z1231 Encounter for screening mammogram for malignant neoplasm of breast: Secondary | ICD-10-CM | POA: Diagnosis not present

## 2020-01-19 DIAGNOSIS — R809 Proteinuria, unspecified: Secondary | ICD-10-CM | POA: Diagnosis not present

## 2020-01-19 DIAGNOSIS — E669 Obesity, unspecified: Secondary | ICD-10-CM | POA: Diagnosis not present

## 2020-01-19 DIAGNOSIS — E782 Mixed hyperlipidemia: Secondary | ICD-10-CM | POA: Diagnosis not present

## 2020-03-19 DIAGNOSIS — Z23 Encounter for immunization: Secondary | ICD-10-CM | POA: Diagnosis not present

## 2020-03-23 IMAGING — CT CT ABD-PELV W/ CM
1 of 3 series · 14 of 32 positions shown, 19 images · IV contrast (APPLIED)
Comparison: 08/20/2018

CLINICAL DATA: Follow-up inflammatory bowel disease.

EXAM:
CT ABDOMEN AND PELVIS WITH CONTRAST
TECHNIQUE: Multidetector CT imaging of the abdomen and pelvis was performed
using the standard protocol following bolus administration of
intravenous contrast.
CONTRAST:  100mL NL7DVT-FTT IOPAMIDOL (NL7DVT-FTT) INJECTION 61%

[Series 2: abd/pelvis w/cm · axial · 0.95mm/px · z∈[-468,-48]mm · 14 of 97 slices shown, 19 images]
[im 7/97  soft-tissue]
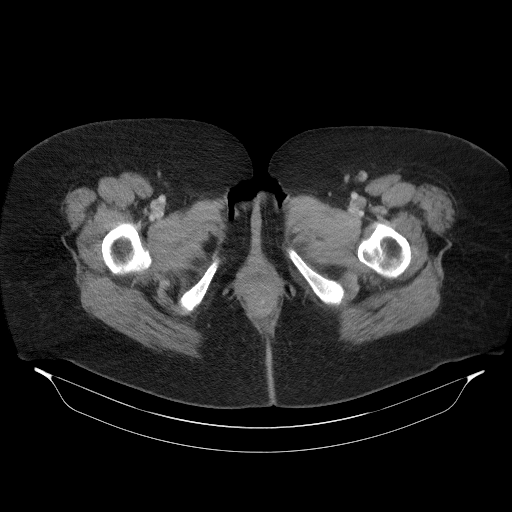
[im 7/97  bone]
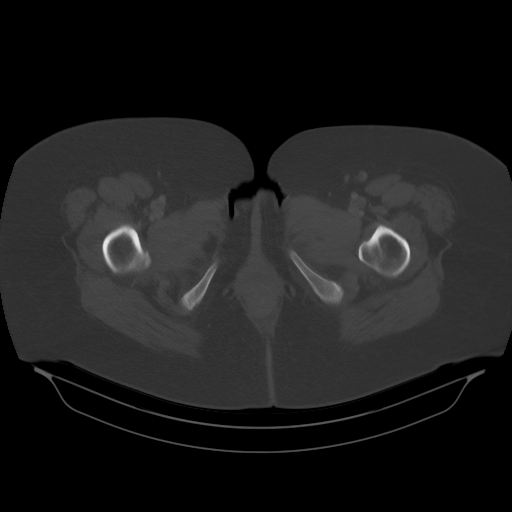
[im 13/97  soft-tissue]
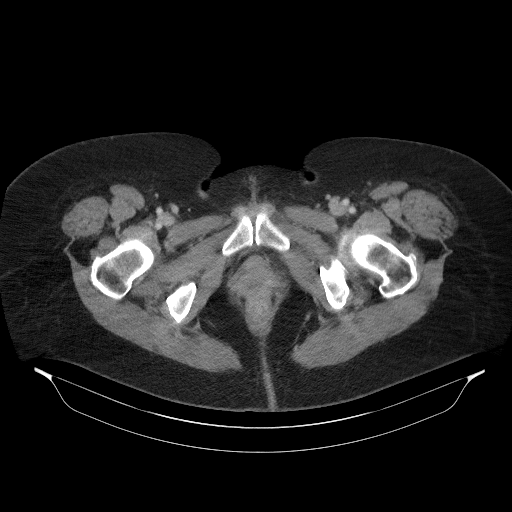
[im 19/97  soft-tissue]
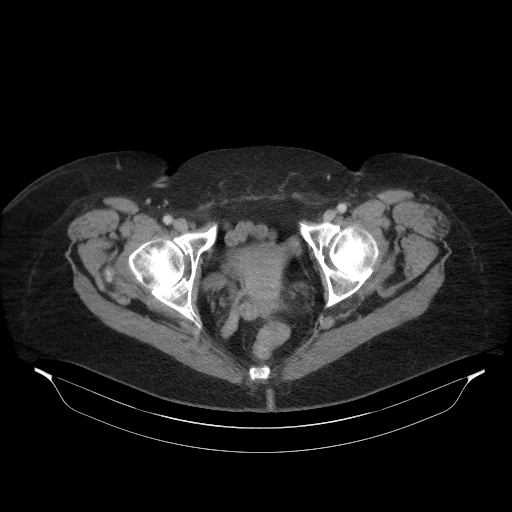
[im 31/97  soft-tissue]
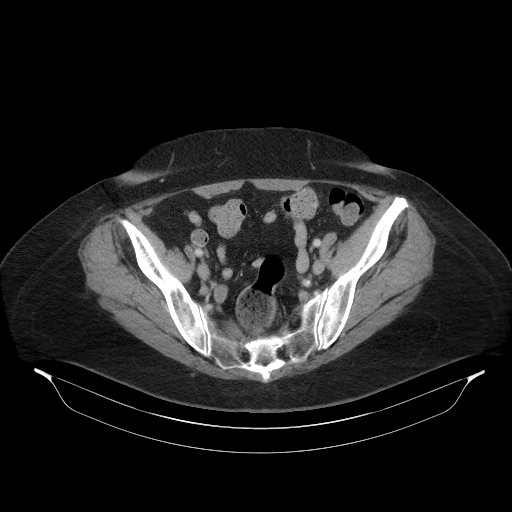
[im 37/97  soft-tissue]
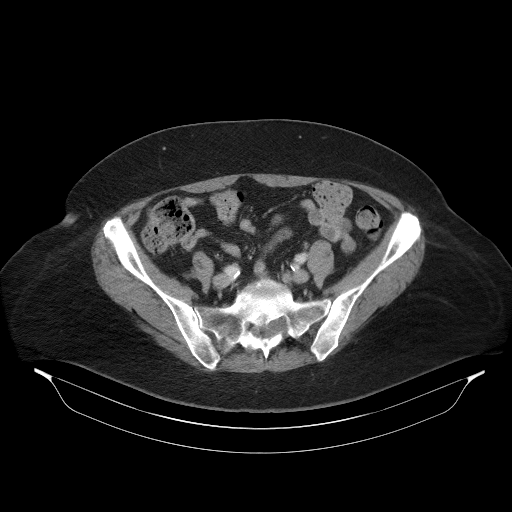
[im 43/97  soft-tissue]
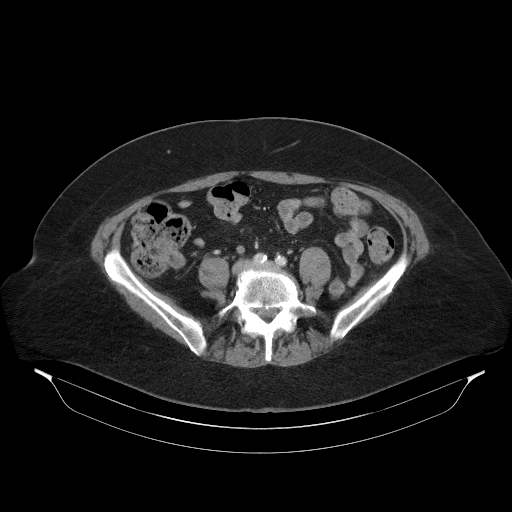
[im 49/97  soft-tissue]
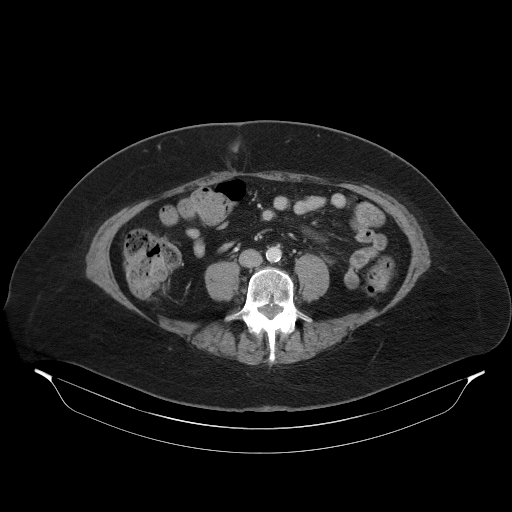
[im 55/97  soft-tissue]
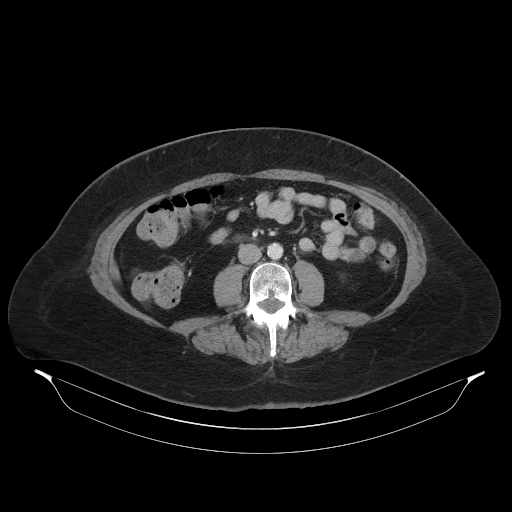
[im 61/97  soft-tissue]
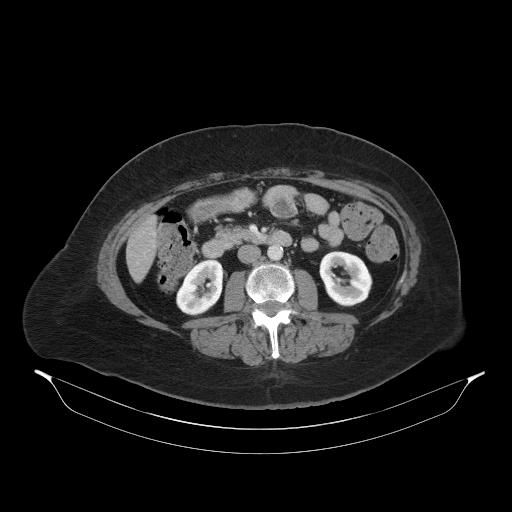
[im 61/97  bone]
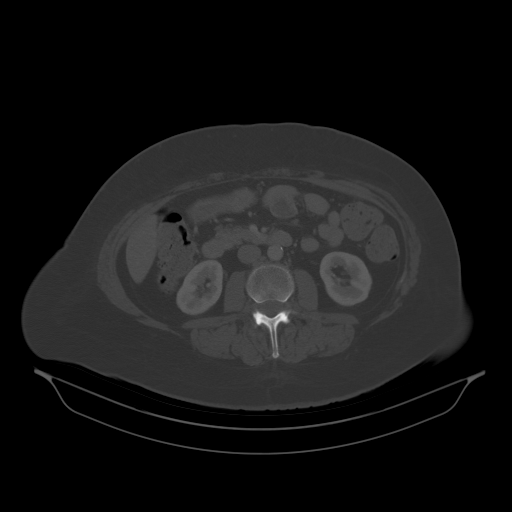
[im 67/97  soft-tissue]
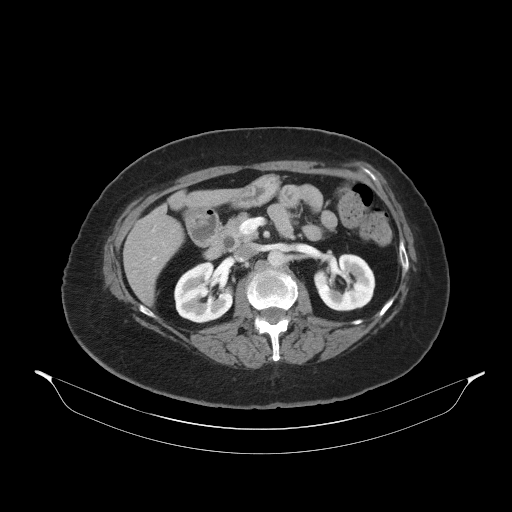
[im 73/97  lung]
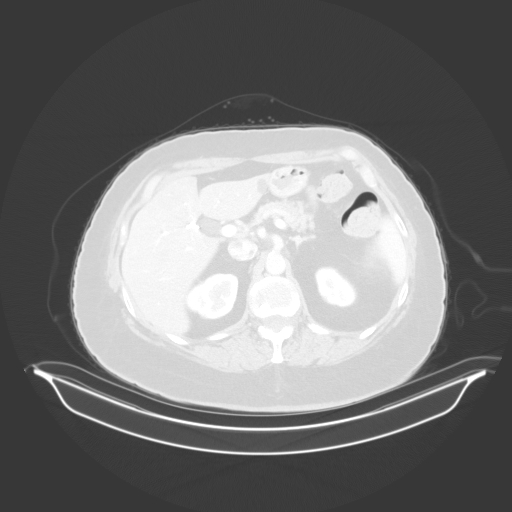
[im 79/97  soft-tissue]
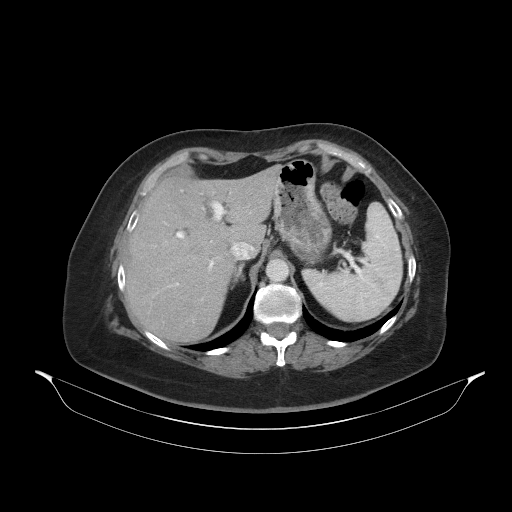
[im 79/97  lung]
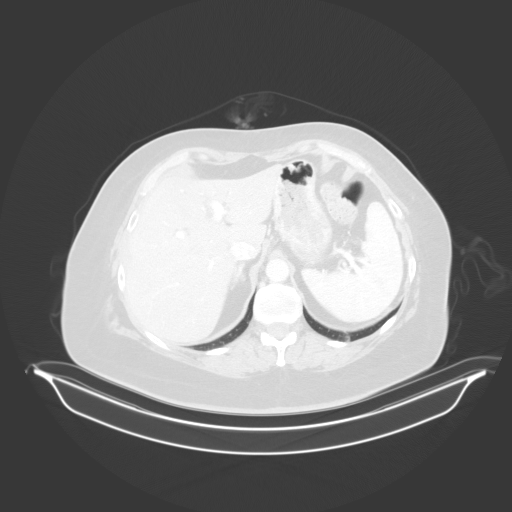
[im 85/97  soft-tissue]
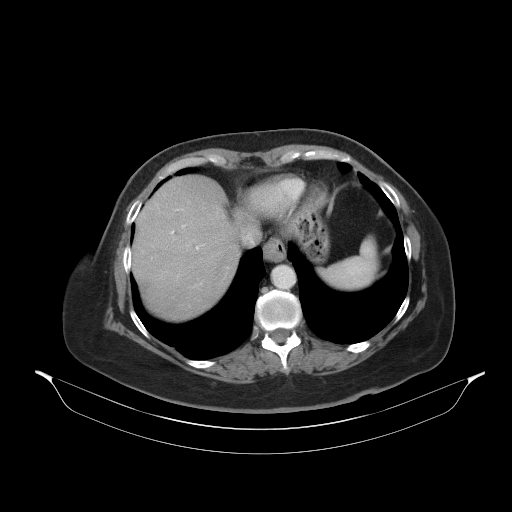
[im 85/97  lung]
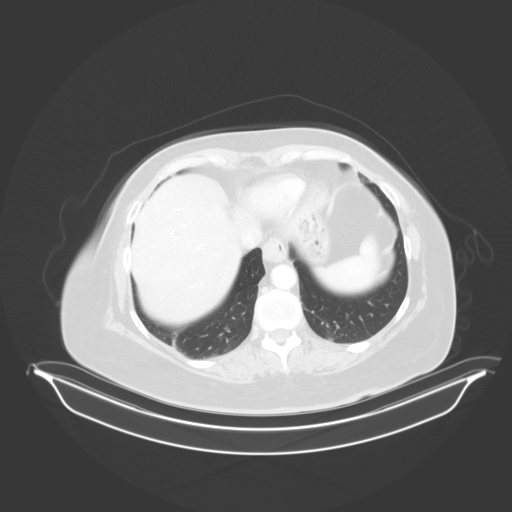
[im 91/97  soft-tissue]
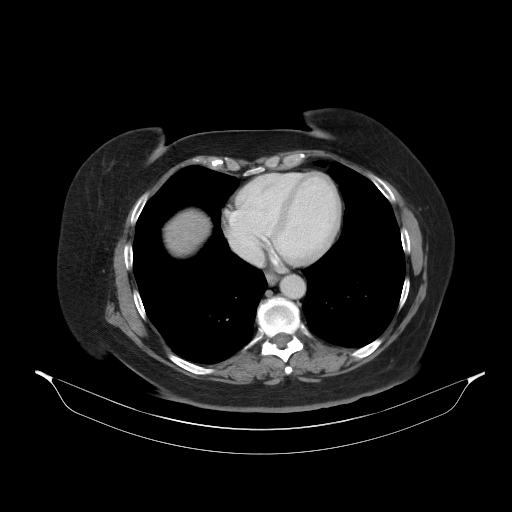
[im 91/97  lung]
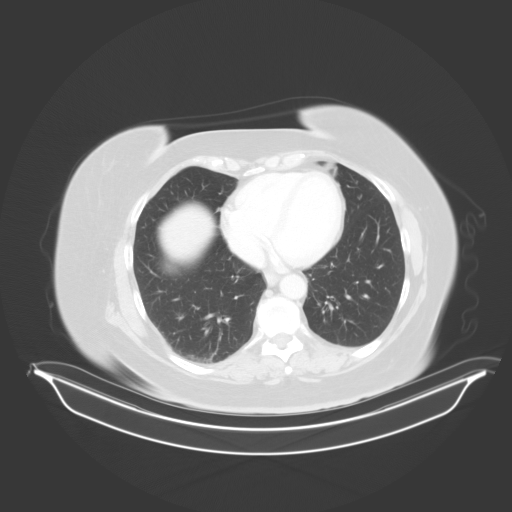

[14 of 32 positions shown; findings below may reference images not displayed]

FINDINGS: Lower Chest: A few tiny sub-cm bibasilar pulmonary nodules remain
stable, largest in lateral right middle lobe measuring 6 mm.

Hepatobiliary: No hepatic masses identified. Small cyst in segment 3
of left lobe remains stable. Prior cholecystectomy. Stable biliary
ductal dilatation.

Pancreas:  No mass or inflammatory changes.

Spleen: Within normal limits in size and appearance.

Adrenals/Urinary Tract: No masses identified. No evidence of
hydronephrosis.

Stomach/Bowel: No evidence of obstruction, inflammatory process or
abnormal fluid collections.

Vascular/Lymphatic: No pathologically enlarged lymph nodes. No
abdominal aortic aneurysm. Aortic atherosclerosis incidentally
noted.

Reproductive: No mass or other significant abnormality. Clips from
previous bilateral tubal ligation again noted.

Other:  None.

Musculoskeletal:  No suspicious bone lesions identified.
IMPRESSION: 1. No evidence of inflammatory bowel disease or other acute
findings.
2. Stable sub-cm bibasilar pulmonary nodules, likely benign in
etiology. Follow-up CT in 12 months from today's scan is considered
optional for low risk patients, but is recommended for high-risk
patients. This recommendation follows the consensus statement:
Guidelines for Management of Small Pulmonary Nodules Detected on CT

## 2020-05-02 DIAGNOSIS — R1314 Dysphagia, pharyngoesophageal phase: Secondary | ICD-10-CM | POA: Diagnosis not present

## 2020-05-02 DIAGNOSIS — Z8619 Personal history of other infectious and parasitic diseases: Secondary | ICD-10-CM | POA: Diagnosis not present

## 2020-05-02 DIAGNOSIS — Z86010 Personal history of colonic polyps: Secondary | ICD-10-CM | POA: Diagnosis not present

## 2020-07-15 DIAGNOSIS — M5430 Sciatica, unspecified side: Secondary | ICD-10-CM | POA: Diagnosis not present

## 2020-07-15 DIAGNOSIS — G9050 Complex regional pain syndrome I, unspecified: Secondary | ICD-10-CM | POA: Diagnosis not present

## 2020-07-15 DIAGNOSIS — F322 Major depressive disorder, single episode, severe without psychotic features: Secondary | ICD-10-CM | POA: Diagnosis not present

## 2020-07-15 DIAGNOSIS — N3281 Overactive bladder: Secondary | ICD-10-CM | POA: Diagnosis not present

## 2020-07-15 DIAGNOSIS — Z0000 Encounter for general adult medical examination without abnormal findings: Secondary | ICD-10-CM | POA: Diagnosis not present

## 2020-07-15 DIAGNOSIS — R7301 Impaired fasting glucose: Secondary | ICD-10-CM | POA: Diagnosis not present

## 2020-07-15 DIAGNOSIS — E782 Mixed hyperlipidemia: Secondary | ICD-10-CM | POA: Diagnosis not present

## 2020-07-15 DIAGNOSIS — I10 Essential (primary) hypertension: Secondary | ICD-10-CM | POA: Diagnosis not present

## 2020-07-15 DIAGNOSIS — K219 Gastro-esophageal reflux disease without esophagitis: Secondary | ICD-10-CM | POA: Diagnosis not present

## 2020-08-02 DIAGNOSIS — N183 Chronic kidney disease, stage 3 unspecified: Secondary | ICD-10-CM | POA: Diagnosis not present

## 2020-08-02 DIAGNOSIS — R944 Abnormal results of kidney function studies: Secondary | ICD-10-CM | POA: Diagnosis not present

## 2020-12-15 ENCOUNTER — Other Ambulatory Visit: Payer: Self-pay | Admitting: Family Medicine

## 2020-12-15 DIAGNOSIS — Z1231 Encounter for screening mammogram for malignant neoplasm of breast: Secondary | ICD-10-CM

## 2021-01-18 DIAGNOSIS — I10 Essential (primary) hypertension: Secondary | ICD-10-CM | POA: Diagnosis not present

## 2021-01-18 DIAGNOSIS — E041 Nontoxic single thyroid nodule: Secondary | ICD-10-CM | POA: Diagnosis not present

## 2021-01-18 DIAGNOSIS — E669 Obesity, unspecified: Secondary | ICD-10-CM | POA: Diagnosis not present

## 2021-01-18 DIAGNOSIS — E782 Mixed hyperlipidemia: Secondary | ICD-10-CM | POA: Diagnosis not present

## 2021-01-18 DIAGNOSIS — R7301 Impaired fasting glucose: Secondary | ICD-10-CM | POA: Diagnosis not present

## 2021-01-18 DIAGNOSIS — Z23 Encounter for immunization: Secondary | ICD-10-CM | POA: Diagnosis not present

## 2021-02-06 ENCOUNTER — Ambulatory Visit: Payer: Medicare HMO

## 2021-03-03 DIAGNOSIS — B029 Zoster without complications: Secondary | ICD-10-CM | POA: Diagnosis not present

## 2021-03-09 ENCOUNTER — Ambulatory Visit: Payer: Medicare HMO

## 2021-03-14 DIAGNOSIS — B029 Zoster without complications: Secondary | ICD-10-CM | POA: Diagnosis not present

## 2021-04-11 ENCOUNTER — Ambulatory Visit: Payer: Medicare HMO

## 2021-09-05 DIAGNOSIS — E782 Mixed hyperlipidemia: Secondary | ICD-10-CM | POA: Diagnosis not present

## 2021-09-05 DIAGNOSIS — K219 Gastro-esophageal reflux disease without esophagitis: Secondary | ICD-10-CM | POA: Diagnosis not present

## 2021-09-05 DIAGNOSIS — G4700 Insomnia, unspecified: Secondary | ICD-10-CM | POA: Diagnosis not present

## 2021-09-05 DIAGNOSIS — Z0000 Encounter for general adult medical examination without abnormal findings: Secondary | ICD-10-CM | POA: Diagnosis not present

## 2021-09-05 DIAGNOSIS — I10 Essential (primary) hypertension: Secondary | ICD-10-CM | POA: Diagnosis not present

## 2021-09-05 DIAGNOSIS — R7301 Impaired fasting glucose: Secondary | ICD-10-CM | POA: Diagnosis not present

## 2021-09-05 DIAGNOSIS — F3342 Major depressive disorder, recurrent, in full remission: Secondary | ICD-10-CM | POA: Diagnosis not present

## 2021-09-05 DIAGNOSIS — G9050 Complex regional pain syndrome I, unspecified: Secondary | ICD-10-CM | POA: Diagnosis not present

## 2021-09-05 DIAGNOSIS — M5430 Sciatica, unspecified side: Secondary | ICD-10-CM | POA: Diagnosis not present

## 2021-09-08 ENCOUNTER — Other Ambulatory Visit: Payer: Self-pay | Admitting: Family Medicine

## 2021-09-08 DIAGNOSIS — E2839 Other primary ovarian failure: Secondary | ICD-10-CM

## 2021-09-11 ENCOUNTER — Ambulatory Visit
Admission: RE | Admit: 2021-09-11 | Discharge: 2021-09-11 | Disposition: A | Discharge status: Home / Self Care | Payer: Medicare HMO | Source: Ambulatory Visit | Attending: Family Medicine | Admitting: Family Medicine

## 2021-09-11 DIAGNOSIS — Z1231 Encounter for screening mammogram for malignant neoplasm of breast: Secondary | ICD-10-CM | POA: Diagnosis not present

## 2021-10-12 ENCOUNTER — Ambulatory Visit
Admission: RE | Admit: 2021-10-12 | Discharge: 2021-10-12 | Disposition: A | Discharge status: Home / Self Care | Payer: Medicare HMO | Source: Ambulatory Visit | Attending: Family Medicine | Admitting: Family Medicine

## 2021-10-12 DIAGNOSIS — Z780 Asymptomatic menopausal state: Secondary | ICD-10-CM | POA: Diagnosis not present

## 2021-10-12 DIAGNOSIS — E2839 Other primary ovarian failure: Secondary | ICD-10-CM

## 2022-03-01 DIAGNOSIS — Z23 Encounter for immunization: Secondary | ICD-10-CM | POA: Diagnosis not present

## 2022-03-01 DIAGNOSIS — E669 Obesity, unspecified: Secondary | ICD-10-CM | POA: Diagnosis not present

## 2022-03-01 DIAGNOSIS — I700 Atherosclerosis of aorta: Secondary | ICD-10-CM | POA: Diagnosis not present

## 2022-03-01 DIAGNOSIS — I10 Essential (primary) hypertension: Secondary | ICD-10-CM | POA: Diagnosis not present

## 2022-03-01 DIAGNOSIS — E782 Mixed hyperlipidemia: Secondary | ICD-10-CM | POA: Diagnosis not present

## 2022-03-01 DIAGNOSIS — R7301 Impaired fasting glucose: Secondary | ICD-10-CM | POA: Diagnosis not present

## 2022-04-27 DIAGNOSIS — Z0100 Encounter for examination of eyes and vision without abnormal findings: Secondary | ICD-10-CM | POA: Diagnosis not present

## 2022-09-12 ENCOUNTER — Other Ambulatory Visit: Payer: Self-pay | Admitting: Family Medicine

## 2022-09-12 DIAGNOSIS — Z0000 Encounter for general adult medical examination without abnormal findings: Secondary | ICD-10-CM | POA: Diagnosis not present

## 2022-09-12 DIAGNOSIS — F3342 Major depressive disorder, recurrent, in full remission: Secondary | ICD-10-CM | POA: Diagnosis not present

## 2022-09-12 DIAGNOSIS — I10 Essential (primary) hypertension: Secondary | ICD-10-CM | POA: Diagnosis not present

## 2022-09-12 DIAGNOSIS — G4700 Insomnia, unspecified: Secondary | ICD-10-CM | POA: Diagnosis not present

## 2022-09-12 DIAGNOSIS — Z1231 Encounter for screening mammogram for malignant neoplasm of breast: Secondary | ICD-10-CM

## 2022-09-12 DIAGNOSIS — R7301 Impaired fasting glucose: Secondary | ICD-10-CM | POA: Diagnosis not present

## 2022-09-12 DIAGNOSIS — G9050 Complex regional pain syndrome I, unspecified: Secondary | ICD-10-CM | POA: Diagnosis not present

## 2022-09-12 DIAGNOSIS — I700 Atherosclerosis of aorta: Secondary | ICD-10-CM | POA: Diagnosis not present

## 2022-09-12 DIAGNOSIS — N183 Chronic kidney disease, stage 3 unspecified: Secondary | ICD-10-CM | POA: Diagnosis not present

## 2022-09-12 DIAGNOSIS — E782 Mixed hyperlipidemia: Secondary | ICD-10-CM | POA: Diagnosis not present

## 2022-09-25 ENCOUNTER — Ambulatory Visit: Payer: Medicare HMO

## 2022-10-11 ENCOUNTER — Ambulatory Visit: Payer: Medicare HMO

## 2022-10-26 ENCOUNTER — Ambulatory Visit
Admission: RE | Admit: 2022-10-26 | Discharge: 2022-10-26 | Disposition: A | Discharge status: Home / Self Care | Payer: Medicare HMO | Source: Ambulatory Visit | Attending: Family Medicine | Admitting: Family Medicine

## 2022-10-26 DIAGNOSIS — Z1231 Encounter for screening mammogram for malignant neoplasm of breast: Secondary | ICD-10-CM

## 2023-03-20 DIAGNOSIS — R7301 Impaired fasting glucose: Secondary | ICD-10-CM | POA: Diagnosis not present

## 2023-03-20 DIAGNOSIS — E782 Mixed hyperlipidemia: Secondary | ICD-10-CM | POA: Diagnosis not present

## 2023-03-20 DIAGNOSIS — Z23 Encounter for immunization: Secondary | ICD-10-CM | POA: Diagnosis not present

## 2023-03-20 DIAGNOSIS — I10 Essential (primary) hypertension: Secondary | ICD-10-CM | POA: Diagnosis not present

## 2023-09-16 DIAGNOSIS — N183 Chronic kidney disease, stage 3 unspecified: Secondary | ICD-10-CM | POA: Diagnosis not present

## 2023-09-16 DIAGNOSIS — G4700 Insomnia, unspecified: Secondary | ICD-10-CM | POA: Diagnosis not present

## 2023-09-16 DIAGNOSIS — E782 Mixed hyperlipidemia: Secondary | ICD-10-CM | POA: Diagnosis not present

## 2023-09-16 DIAGNOSIS — K219 Gastro-esophageal reflux disease without esophagitis: Secondary | ICD-10-CM | POA: Diagnosis not present

## 2023-09-16 DIAGNOSIS — G9050 Complex regional pain syndrome I, unspecified: Secondary | ICD-10-CM | POA: Diagnosis not present

## 2023-09-16 DIAGNOSIS — Z1331 Encounter for screening for depression: Secondary | ICD-10-CM | POA: Diagnosis not present

## 2023-09-16 DIAGNOSIS — Z0000 Encounter for general adult medical examination without abnormal findings: Secondary | ICD-10-CM | POA: Diagnosis not present

## 2023-09-16 DIAGNOSIS — F3342 Major depressive disorder, recurrent, in full remission: Secondary | ICD-10-CM | POA: Diagnosis not present

## 2023-09-16 DIAGNOSIS — I129 Hypertensive chronic kidney disease with stage 1 through stage 4 chronic kidney disease, or unspecified chronic kidney disease: Secondary | ICD-10-CM | POA: Diagnosis not present

## 2023-09-16 DIAGNOSIS — R7301 Impaired fasting glucose: Secondary | ICD-10-CM | POA: Diagnosis not present

## 2023-09-30 DIAGNOSIS — Z86018 Personal history of other benign neoplasm: Secondary | ICD-10-CM | POA: Diagnosis not present

## 2023-09-30 DIAGNOSIS — R1310 Dysphagia, unspecified: Secondary | ICD-10-CM | POA: Diagnosis not present

## 2023-10-12 DIAGNOSIS — I10 Essential (primary) hypertension: Secondary | ICD-10-CM | POA: Diagnosis not present

## 2023-10-12 DIAGNOSIS — E669 Obesity, unspecified: Secondary | ICD-10-CM | POA: Diagnosis not present

## 2023-10-12 DIAGNOSIS — F3342 Major depressive disorder, recurrent, in full remission: Secondary | ICD-10-CM | POA: Diagnosis not present

## 2023-10-15 DIAGNOSIS — K2930 Chronic superficial gastritis without bleeding: Secondary | ICD-10-CM | POA: Diagnosis not present

## 2023-10-15 DIAGNOSIS — K2940 Chronic atrophic gastritis without bleeding: Secondary | ICD-10-CM | POA: Diagnosis not present

## 2023-10-15 DIAGNOSIS — Q399 Congenital malformation of esophagus, unspecified: Secondary | ICD-10-CM | POA: Diagnosis not present

## 2023-10-15 DIAGNOSIS — K2289 Other specified disease of esophagus: Secondary | ICD-10-CM | POA: Diagnosis not present

## 2023-10-15 DIAGNOSIS — D120 Benign neoplasm of cecum: Secondary | ICD-10-CM | POA: Diagnosis not present

## 2023-10-15 DIAGNOSIS — K31A15 Gastric intestinal metaplasia without dysplasia, involving multiple sites: Secondary | ICD-10-CM | POA: Diagnosis not present

## 2023-10-15 DIAGNOSIS — K31A19 Gastric intestinal metaplasia without dysplasia, unspecified site: Secondary | ICD-10-CM | POA: Diagnosis not present

## 2023-10-15 DIAGNOSIS — R130 Aphagia: Secondary | ICD-10-CM | POA: Diagnosis not present

## 2023-10-15 DIAGNOSIS — Z860101 Personal history of adenomatous and serrated colon polyps: Secondary | ICD-10-CM | POA: Diagnosis not present

## 2023-10-15 DIAGNOSIS — Z09 Encounter for follow-up examination after completed treatment for conditions other than malignant neoplasm: Secondary | ICD-10-CM | POA: Diagnosis not present

## 2023-10-15 DIAGNOSIS — D122 Benign neoplasm of ascending colon: Secondary | ICD-10-CM | POA: Diagnosis not present

## 2023-10-15 DIAGNOSIS — K3189 Other diseases of stomach and duodenum: Secondary | ICD-10-CM | POA: Diagnosis not present

## 2023-10-17 ENCOUNTER — Other Ambulatory Visit: Payer: Self-pay | Admitting: Family Medicine

## 2023-10-17 DIAGNOSIS — Z1231 Encounter for screening mammogram for malignant neoplasm of breast: Secondary | ICD-10-CM

## 2023-10-22 DIAGNOSIS — K31A19 Gastric intestinal metaplasia without dysplasia, unspecified site: Secondary | ICD-10-CM | POA: Diagnosis not present

## 2023-10-22 DIAGNOSIS — D122 Benign neoplasm of ascending colon: Secondary | ICD-10-CM | POA: Diagnosis not present

## 2023-10-22 DIAGNOSIS — K2289 Other specified disease of esophagus: Secondary | ICD-10-CM | POA: Diagnosis not present

## 2023-10-22 DIAGNOSIS — K2940 Chronic atrophic gastritis without bleeding: Secondary | ICD-10-CM | POA: Diagnosis not present

## 2023-10-22 DIAGNOSIS — D120 Benign neoplasm of cecum: Secondary | ICD-10-CM | POA: Diagnosis not present

## 2023-10-25 ENCOUNTER — Other Ambulatory Visit (HOSPITAL_COMMUNITY): Payer: Self-pay | Admitting: Gastroenterology

## 2023-10-25 DIAGNOSIS — R140 Abdominal distension (gaseous): Secondary | ICD-10-CM

## 2023-10-28 ENCOUNTER — Ambulatory Visit

## 2023-11-08 ENCOUNTER — Encounter (HOSPITAL_COMMUNITY): Payer: Self-pay

## 2023-11-08 ENCOUNTER — Ambulatory Visit (HOSPITAL_COMMUNITY)

## 2023-11-11 ENCOUNTER — Ambulatory Visit
Admission: RE | Admit: 2023-11-11 | Discharge: 2023-11-11 | Disposition: A | Discharge status: Home / Self Care | Source: Ambulatory Visit | Attending: Family Medicine | Admitting: Family Medicine

## 2023-11-11 DIAGNOSIS — E669 Obesity, unspecified: Secondary | ICD-10-CM | POA: Diagnosis not present

## 2023-11-11 DIAGNOSIS — F3342 Major depressive disorder, recurrent, in full remission: Secondary | ICD-10-CM | POA: Diagnosis not present

## 2023-11-11 DIAGNOSIS — I10 Essential (primary) hypertension: Secondary | ICD-10-CM | POA: Diagnosis not present

## 2023-11-11 DIAGNOSIS — Z1231 Encounter for screening mammogram for malignant neoplasm of breast: Secondary | ICD-10-CM | POA: Diagnosis not present

## 2023-12-12 DIAGNOSIS — E669 Obesity, unspecified: Secondary | ICD-10-CM | POA: Diagnosis not present

## 2023-12-12 DIAGNOSIS — F3342 Major depressive disorder, recurrent, in full remission: Secondary | ICD-10-CM | POA: Diagnosis not present

## 2023-12-12 DIAGNOSIS — I10 Essential (primary) hypertension: Secondary | ICD-10-CM | POA: Diagnosis not present

## 2024-01-12 DIAGNOSIS — F3342 Major depressive disorder, recurrent, in full remission: Secondary | ICD-10-CM | POA: Diagnosis not present

## 2024-01-12 DIAGNOSIS — I10 Essential (primary) hypertension: Secondary | ICD-10-CM | POA: Diagnosis not present

## 2024-01-12 DIAGNOSIS — E669 Obesity, unspecified: Secondary | ICD-10-CM | POA: Diagnosis not present

## 2024-03-25 DIAGNOSIS — E782 Mixed hyperlipidemia: Secondary | ICD-10-CM | POA: Diagnosis not present

## 2024-03-25 DIAGNOSIS — I129 Hypertensive chronic kidney disease with stage 1 through stage 4 chronic kidney disease, or unspecified chronic kidney disease: Secondary | ICD-10-CM | POA: Diagnosis not present

## 2024-03-25 DIAGNOSIS — R7303 Prediabetes: Secondary | ICD-10-CM | POA: Diagnosis not present

## 2024-03-25 DIAGNOSIS — G9050 Complex regional pain syndrome I, unspecified: Secondary | ICD-10-CM | POA: Diagnosis not present

## 2024-03-25 DIAGNOSIS — N183 Chronic kidney disease, stage 3 unspecified: Secondary | ICD-10-CM | POA: Diagnosis not present

## 2024-03-26 DIAGNOSIS — Z23 Encounter for immunization: Secondary | ICD-10-CM | POA: Diagnosis not present
